# Patient Record
Sex: Male | Born: 1985 | Race: White | Hispanic: No | Marital: Married | Smoking: Never smoker
Health system: Southern US, Community
[De-identification: ages and names within clinical notes are randomized; demographics above are authoritative.]

## PROBLEM LIST (undated history)

## (undated) DIAGNOSIS — K219 Gastro-esophageal reflux disease without esophagitis: Secondary | ICD-10-CM

## (undated) DIAGNOSIS — F4310 Post-traumatic stress disorder, unspecified: Secondary | ICD-10-CM

## (undated) DIAGNOSIS — A6920 Lyme disease, unspecified: Secondary | ICD-10-CM

## (undated) DIAGNOSIS — A770 Spotted fever due to Rickettsia rickettsii: Secondary | ICD-10-CM

## (undated) DIAGNOSIS — I10 Essential (primary) hypertension: Secondary | ICD-10-CM

## (undated) DIAGNOSIS — J302 Other seasonal allergic rhinitis: Secondary | ICD-10-CM

## (undated) HISTORY — PX: ESOPHAGOGASTRODUODENOSCOPY ENDOSCOPY: SHX5814

## (undated) HISTORY — PX: KNEE SURGERY: SHX244

---

## 2011-05-05 ENCOUNTER — Encounter: Payer: Self-pay | Admitting: *Deleted

## 2011-05-05 ENCOUNTER — Emergency Department (HOSPITAL_COMMUNITY): Payer: Self-pay

## 2011-05-05 ENCOUNTER — Emergency Department (HOSPITAL_COMMUNITY)
Admission: EM | Admit: 2011-05-05 | Discharge: 2011-05-05 | Disposition: A | Discharge status: Home / Self Care | Payer: Self-pay | Attending: Emergency Medicine | Admitting: Emergency Medicine

## 2011-05-05 DIAGNOSIS — W260XXA Contact with knife, initial encounter: Secondary | ICD-10-CM | POA: Insufficient documentation

## 2011-05-05 DIAGNOSIS — W261XXA Contact with sword or dagger, initial encounter: Secondary | ICD-10-CM | POA: Insufficient documentation

## 2011-05-05 DIAGNOSIS — S61209A Unspecified open wound of unspecified finger without damage to nail, initial encounter: Secondary | ICD-10-CM | POA: Insufficient documentation

## 2011-05-05 DIAGNOSIS — Y92009 Unspecified place in unspecified non-institutional (private) residence as the place of occurrence of the external cause: Secondary | ICD-10-CM | POA: Insufficient documentation

## 2011-05-05 MED ORDER — HYDROCODONE-ACETAMINOPHEN 5-325 MG PO TABS: 1.0000 | ORAL_TABLET | ORAL | Status: AC | PRN

## 2011-05-05 MED ORDER — BUPIVACAINE HCL (PF) 0.5 % IJ SOLN
10.0000 mL | Freq: Once | INTRAMUSCULAR | Status: DC
  Filled 2011-05-05 (×2): qty 30

## 2011-05-05 MED ORDER — CEPHALEXIN 500 MG PO CAPS: 500.0000 mg | ORAL_CAPSULE | Freq: Four times a day (QID) | ORAL | Status: AC

## 2011-05-05 NOTE — ED Notes (Signed)
Pt reports was cutting weedeater line and accidentally cut tip of R index finger off.  Dressing was applied by triage rn and pt elevating extremitiy.

## 2011-05-05 NOTE — ED Notes (Signed)
Pt c/o laceration to his index finger with knife. Pt states that he closed his knife and cut the tip of his finger off. Pt has avulsion to his rt index finger and brought the tip of his finger with him. Pressure dressing applied and arm elevated.

## 2011-05-05 NOTE — ED Notes (Signed)
Dr. Allie Bossier called for Stuart Cook.

## 2011-05-05 NOTE — ED Notes (Signed)
PA Julie Idol at bedside. 

## 2011-05-09 NOTE — ED Provider Notes (Signed)
History     CSN: [id number] Arrival date & time: 05/05/2011  1:10 PM  Chief Complaint  Patient presents with  . Extremity Laceration   Patient is a 25 y.o. male presenting with skin laceration. The history is provided by the patient.  Laceration  The incident occurred less than 1 hour ago. The laceration is located on the right hand (index finger). Size: 0.5 cm round avulsion to volar distal pad.   The laceration mechanism was a a clean knife. The pain is at a severity of 5/10. The pain is moderate. The pain has been constant since onset. He reports no foreign bodies present. His tetanus status is UTD.    History reviewed. No pertinent past medical history.  Past Surgical History  Procedure Date  . Knee surgery     right    History reviewed. No pertinent family history.  History  Substance Use Topics  . Smoking status: Never Smoker   . Smokeless tobacco: Not on file  . Alcohol Use: No      Review of Systems  All other systems reviewed and are negative.    Physical Exam  BP 126/73  Pulse 84  Temp(Src) 98.3 F (36.8 C) (Oral)  Resp 20  Ht 6\' 1"  (1.854 m)  Wt 240 lb (108.863 kg)  BMI 31.66 kg/m2  SpO2 96%  Physical Exam  Nursing note and vitals reviewed. Constitutional: He is oriented to person, place, and time. He appears well-developed and well-nourished.  HENT:  Head: Normocephalic and atraumatic.  Eyes: Conjunctivae are normal.  Neck: Normal range of motion.  Cardiovascular: Normal rate and intact distal pulses.   Pulmonary/Chest: Effort normal.  Abdominal: Soft. Bowel sounds are normal. There is no tenderness.  Musculoskeletal: Normal range of motion.  Neurological: He is alert and oriented to person, place, and time.  Skin: Skin is warm and dry.       Avulsion right distal volar index finger with small venous bleeding noted.    Psychiatric: He has a normal mood and affect.    ED Course  LACERATION REPAIR Performed by: Tammara Massing L Authorized by:  Candis Musa Consent: Verbal consent obtained. Consent given by: patient Patient understanding: patient states understanding of the procedure being performed Body area: upper extremity Location details: right hand Laceration length: 0.5 cm Tendon involvement: none Nerve involvement: none Vascular damage: venule actively bled, improved slightly after cautery .  Applied wound seal to avulsion and applied pressure until wound was hemostatic. Anesthesia: digital block Local anesthetic: bupivacaine 0.5% with epinephrine Anesthetic total: 2 ml Preparation: Patient was prepped and draped in the usual sterile fashion. Irrigation solution: saline Irrigation method: syringe Amount of cleaning: extensive Debridement: none Wound skin closure material used: No skin closure appropriate for this avulsion injury. Dressing: 4x4 sterile gauze Patient tolerance: Patient tolerated the procedure well with no immediate complications.    MDM Avulsion injury to distal right index finger.  Spoke with Dr Magnus Ivan of hand who will followup with pt as outpatient - willing to see in office tomorrow.      Candis Musa, PA 05/09/11 0151

## 2011-05-11 NOTE — ED Provider Notes (Signed)
History/physical exam/procedure(s) were performed by non-physician practitioner and as supervising physician I was immediately available for consultation/collaboration. I have reviewed all notes and am in agreement with care and plan.  Hilario Quarry, MD 05/11/11 [phone]

## 2014-01-12 ENCOUNTER — Emergency Department (HOSPITAL_COMMUNITY): Payer: BC Managed Care – PPO

## 2014-01-12 ENCOUNTER — Emergency Department (HOSPITAL_COMMUNITY)
Admission: EM | Admit: 2014-01-12 | Discharge: 2014-01-12 | Disposition: A | Discharge status: Home / Self Care | Payer: BC Managed Care – PPO | Attending: Emergency Medicine | Admitting: Emergency Medicine

## 2014-01-12 ENCOUNTER — Encounter (HOSPITAL_COMMUNITY): Payer: Self-pay | Admitting: Emergency Medicine

## 2014-01-12 DIAGNOSIS — IMO0002 Reserved for concepts with insufficient information to code with codable children: Secondary | ICD-10-CM | POA: Insufficient documentation

## 2014-01-12 DIAGNOSIS — R079 Chest pain, unspecified: Secondary | ICD-10-CM | POA: Insufficient documentation

## 2014-01-12 DIAGNOSIS — R0602 Shortness of breath: Secondary | ICD-10-CM | POA: Insufficient documentation

## 2014-01-12 LAB — COMPREHENSIVE METABOLIC PANEL
ALT: 21 U/L (ref 0–53)
AST: 20 U/L (ref 0–37)
Albumin: 3.9 g/dL (ref 3.5–5.2)
Alkaline Phosphatase: 87 U/L (ref 39–117)
BUN: 17 mg/dL (ref 6–23)
CO2: 28 mEq/L (ref 19–32)
Calcium: 9.5 mg/dL (ref 8.4–10.5)
Chloride: 101 mEq/L (ref 96–112)
Creatinine, Ser: 1.22 mg/dL (ref 0.50–1.35)
GFR calc Af Amer: >90 mL/min (ref >90)
GFR calc non Af Amer: 81 mL/min — ABNORMAL LOW (ref >90)
Glucose, Bld: 92 mg/dL (ref 70–99)
Potassium: 4.1 mEq/L (ref 3.7–5.3)
Sodium: 141 mEq/L (ref 137–147)
Total Bilirubin: 0.5 mg/dL (ref 0.3–1.2)
Total Protein: 7.8 g/dL (ref 6.0–8.3)

## 2014-01-12 LAB — CBC WITH DIFFERENTIAL/PLATELET
Basophils Absolute: 0 10*3/uL (ref 0.0–0.1)
Basophils Relative: 0 % (ref 0–1)
Eosinophils Absolute: 0.4 10*3/uL (ref 0.0–0.7)
Eosinophils Relative: 4 % (ref 0–5)
HCT: 43.9 % (ref 39.0–52.0)
Hemoglobin: 15.7 g/dL (ref 13.0–17.0)
Lymphocytes Relative: 29 % (ref 12–46)
Lymphs Abs: 3.1 10*3/uL (ref 0.7–4.0)
MCH: 30.5 pg (ref 26.0–34.0)
MCHC: 35.8 g/dL (ref 30.0–36.0)
MCV: 85.4 fL (ref 78.0–100.0)
Monocytes Absolute: 0.8 10*3/uL (ref 0.1–1.0)
Monocytes Relative: 8 % (ref 3–12)
Neutro Abs: 6.3 10*3/uL (ref 1.7–7.7)
Neutrophils Relative %: 59 % (ref 43–77)
Platelets: 212 10*3/uL (ref 150–400)
RBC: 5.14 MIL/uL (ref 4.22–5.81)
RDW: 12.6 % (ref 11.5–15.5)
WBC: 10.7 10*3/uL — ABNORMAL HIGH (ref 4.0–10.5)

## 2014-01-12 LAB — TROPONIN I: Troponin I: <0.3 ng/mL (ref <0.30)

## 2014-01-12 LAB — D-DIMER, QUANTITATIVE: D-Dimer, Quant: <0.27 ug/mL-FEU (ref 0.00–0.48)

## 2014-01-12 MED ORDER — OXYCODONE-ACETAMINOPHEN 5-325 MG PO TABS
1.0000 | ORAL_TABLET | Freq: Once | ORAL | Status: AC
  Administered 2014-01-12: 1 via ORAL
  Filled 2014-01-12: qty 1

## 2014-01-12 MED ORDER — HYDROCODONE-ACETAMINOPHEN 5-325 MG PO TABS: 1.0000 | ORAL_TABLET | Freq: Four times a day (QID) | ORAL | Status: DC | PRN

## 2014-01-12 NOTE — ED Notes (Signed)
Patient given discharge instruction, verbalized understand. Patient ambulatory out of the department.  

## 2014-01-12 NOTE — ED Notes (Signed)
Pain central chest , onset on Friday, in rt lat chest.  Cough, nonproductive, vomited , feels that food is not going down to stomach.

## 2014-01-12 NOTE — ED Provider Notes (Signed)
CSN: [id number]     Arrival date & time 01/12/14  2008 History  This chart was scribed for Stuart LennertJoseph L Catrina Fellenz, MD by Bennett Scrapehristina Taylor, ED Scribe. This patient was seen in room APA04/[facility]04 and the patient's care was started at 8:45 PM.   Chief Complaint  Patient presents with  . Chest Pain      Patient is a 28 y.o. male presenting with chest pain. The history is provided by the patient. No language interpreter was used.  Chest Pain Pain location:  Substernal area Pain quality comment:  "pulled muscle" Duration:  4 days Chronicity:  New Associated symptoms: shortness of breath   Associated symptoms: no abdominal pain, no back pain, no cough, no fatigue and no headache   Risk factors: no smoking     HPI Comments: Stuart Cook is a 28 y.o. male who presents to the Emergency Department complaining of substernal CP that started 4 days ago. He states that the pain originally started at the "top of the ribs" on the right and felt like a "pulled muscle". He became concerned when he developed associated SOB and the pain relocated to the center chest. He denies any prior episodes of the same. He denies any recent cough or leg pain or swelling. He denies any h/o smoking.  History reviewed. No pertinent past medical history. Past Surgical History  Procedure Laterality Date  . Knee surgery      right  . Esophagogastroduodenoscopy endoscopy     History reviewed. No pertinent family history. History  Substance Use Topics  . Smoking status: Never Smoker   . Smokeless tobacco: Not on file  . Alcohol Use: No    Review of Systems  Constitutional: Negative for appetite change and fatigue.  HENT: Negative for congestion, ear discharge and sinus pressure.   Eyes: Negative for discharge.  Respiratory: Positive for shortness of breath. Negative for cough.   Cardiovascular: Positive for chest pain.  Gastrointestinal: Negative for abdominal pain and diarrhea.  Genitourinary: Negative for frequency  and hematuria.  Musculoskeletal: Negative for back pain.  Skin: Negative for rash.  Neurological: Negative for seizures and headaches.  Psychiatric/Behavioral: Negative for hallucinations.      Allergies  Review of patient's allergies indicates no known allergies.  Home Medications   Prior to Admission medications   Medication Sig Start Date End Date Taking? Authorizing Provider  Aspirin-Acetaminophen-Caffeine (GOODY HEADACHE PO) Take 1 packet by mouth daily as needed. For plain    Yes Historical Provider, MD  fluticasone (FLONASE) 50 MCG/ACT nasal spray Place 1 spray into both nostrils every morning. 12/09/13  Yes Historical Provider, MD  omeprazole (PRILOSEC) 20 MG capsule Take 20 mg by mouth once as needed.   Yes Historical Provider, MD  simethicone (GAS-X) 80 MG chewable tablet Chew 80 mg by mouth once as needed for flatulence.   Yes Historical Provider, MD   Triage Vitals: BP 126/82  Pulse 81  Temp(Src) 98.3 F (36.8 C) (Oral)  Resp 20  Ht 6\' 1"  (1.854 m)  Wt 287 lb (130.182 kg)  BMI 37.87 kg/m2  SpO2 97%  Physical Exam  Nursing note and vitals reviewed. Constitutional: He is oriented to person, place, and time. He appears well-developed and well-nourished.  HENT:  Head: Normocephalic and atraumatic.  Eyes: Conjunctivae and EOM are normal. No scleral icterus.  Neck: Neck supple. No thyromegaly present.  Cardiovascular: Normal rate and regular rhythm.  Exam reveals no gallop and no friction rub.   No murmur heard. Pulmonary/Chest: Effort  normal and breath sounds normal. No stridor. He has no wheezes. He has no rales. He exhibits no tenderness.  Abdominal: He exhibits no distension. There is no tenderness. There is no rebound.  Musculoskeletal: Normal range of motion. He exhibits no edema.  Lymphadenopathy:    He has no cervical adenopathy.  Neurological: He is alert and oriented to person, place, and time. He exhibits normal muscle tone. Coordination normal.  Skin:  Skin is warm and dry. No rash noted. No erythema.  Psychiatric: He has a normal mood and affect. His behavior is normal.    ED Course  Procedures (including critical care time)  Medications  oxyCODONE-acetaminophen (PERCOCET/ROXICET) 5-325 MG per tablet 1 tablet (not administered)    DIAGNOSTIC STUDIES: Oxygen Saturation is 97% on RA, adequate by my interpretation.    COORDINATION OF CARE: 8:50 PM-Discussed treatment plan which includes pain meds, CXR, CBC panel, CMP, troponin and d-dimer with pt at bedside and pt agreed to plan.   Labs Review Labs Reviewed - No data to display  Imaging Review No results found.   EKG Interpretation None      MDM   Final diagnoses:  None    The chart was scribed for me under my direct supervision.  I personally performed the history, physical, and medical decision making and all procedures in the evaluation of this patient.Stuart Lennert.      Karinda Cabriales L Kessler Kopinski, MD 01/12/14 [phone]

## 2014-01-12 NOTE — Discharge Instructions (Signed)
Continue the prilosec and follow up with your md this week

## 2014-02-05 ENCOUNTER — Inpatient Hospital Stay (HOSPITAL_COMMUNITY): Payer: BC Managed Care – PPO

## 2014-02-05 ENCOUNTER — Emergency Department (HOSPITAL_COMMUNITY): Payer: BC Managed Care – PPO

## 2014-02-05 ENCOUNTER — Encounter (HOSPITAL_COMMUNITY): Payer: Self-pay | Admitting: Emergency Medicine

## 2014-02-05 ENCOUNTER — Inpatient Hospital Stay (HOSPITAL_COMMUNITY)
Admission: EM | Admit: 2014-02-05 | Discharge: 2014-02-06 | DRG: 871 | Disposition: A | Discharge status: Home / Self Care | Payer: BC Managed Care – PPO | Attending: Internal Medicine | Admitting: Internal Medicine

## 2014-02-05 DIAGNOSIS — Z8249 Family history of ischemic heart disease and other diseases of the circulatory system: Secondary | ICD-10-CM | POA: Diagnosis not present

## 2014-02-05 DIAGNOSIS — R0902 Hypoxemia: Secondary | ICD-10-CM | POA: Diagnosis present

## 2014-02-05 DIAGNOSIS — Z789 Other specified health status: Secondary | ICD-10-CM

## 2014-02-05 DIAGNOSIS — R519 Headache, unspecified: Secondary | ICD-10-CM | POA: Diagnosis present

## 2014-02-05 DIAGNOSIS — A938 Other specified arthropod-borne viral fevers: Secondary | ICD-10-CM | POA: Diagnosis present

## 2014-02-05 DIAGNOSIS — R51 Headache: Secondary | ICD-10-CM | POA: Diagnosis present

## 2014-02-05 DIAGNOSIS — G9340 Encephalopathy, unspecified: Secondary | ICD-10-CM | POA: Diagnosis present

## 2014-02-05 DIAGNOSIS — Z9189 Other specified personal risk factors, not elsewhere classified: Secondary | ICD-10-CM | POA: Diagnosis present

## 2014-02-05 DIAGNOSIS — B349 Viral infection, unspecified: Secondary | ICD-10-CM

## 2014-02-05 DIAGNOSIS — J320 Chronic maxillary sinusitis: Secondary | ICD-10-CM | POA: Diagnosis present

## 2014-02-05 DIAGNOSIS — T40605A Adverse effect of unspecified narcotics, initial encounter: Secondary | ICD-10-CM | POA: Diagnosis present

## 2014-02-05 DIAGNOSIS — I10 Essential (primary) hypertension: Secondary | ICD-10-CM | POA: Diagnosis present

## 2014-02-05 DIAGNOSIS — A419 Sepsis, unspecified organism: Principal | ICD-10-CM | POA: Diagnosis present

## 2014-02-05 DIAGNOSIS — R6510 Systemic inflammatory response syndrome (SIRS) of non-infectious origin without acute organ dysfunction: Secondary | ICD-10-CM | POA: Diagnosis present

## 2014-02-05 DIAGNOSIS — K219 Gastro-esophageal reflux disease without esophagitis: Secondary | ICD-10-CM | POA: Diagnosis present

## 2014-02-05 DIAGNOSIS — R112 Nausea with vomiting, unspecified: Secondary | ICD-10-CM | POA: Diagnosis present

## 2014-02-05 DIAGNOSIS — R509 Fever, unspecified: Secondary | ICD-10-CM | POA: Diagnosis present

## 2014-02-05 HISTORY — DX: Other seasonal allergic rhinitis: J30.2

## 2014-02-05 HISTORY — DX: Lyme disease, unspecified: A69.20

## 2014-02-05 HISTORY — DX: Gastro-esophageal reflux disease without esophagitis: K21.9

## 2014-02-05 HISTORY — DX: Spotted fever due to Rickettsia rickettsii: A77.0

## 2014-02-05 HISTORY — DX: Essential (primary) hypertension: I10

## 2014-02-05 LAB — COMPREHENSIVE METABOLIC PANEL
ALT: 24 U/L (ref 0–53)
AST: 23 U/L (ref 0–37)
Albumin: 3.6 g/dL (ref 3.5–5.2)
Alkaline Phosphatase: 81 U/L (ref 39–117)
BUN: 13 mg/dL (ref 6–23)
CO2: 22 mEq/L (ref 19–32)
Calcium: 8.7 mg/dL (ref 8.4–10.5)
Chloride: 97 mEq/L (ref 96–112)
Creatinine, Ser: 1.04 mg/dL (ref 0.50–1.35)
GFR calc Af Amer: >90 mL/min (ref >90)
GFR calc non Af Amer: >90 mL/min (ref >90)
Glucose, Bld: 141 mg/dL — ABNORMAL HIGH (ref 70–99)
Potassium: 3.7 mEq/L (ref 3.7–5.3)
Sodium: 135 mEq/L — ABNORMAL LOW (ref 137–147)
Total Bilirubin: 0.7 mg/dL (ref 0.3–1.2)
Total Protein: 7.3 g/dL (ref 6.0–8.3)

## 2014-02-05 LAB — CBC WITH DIFFERENTIAL/PLATELET
Basophils Absolute: 0 10*3/uL (ref 0.0–0.1)
Basophils Relative: 0 % (ref 0–1)
Eosinophils Absolute: 0 10*3/uL (ref 0.0–0.7)
Eosinophils Relative: 0 % (ref 0–5)
HCT: 42 % (ref 39.0–52.0)
Hemoglobin: 14.7 g/dL (ref 13.0–17.0)
Lymphocytes Relative: 10 % — ABNORMAL LOW (ref 12–46)
Lymphs Abs: 0.9 10*3/uL (ref 0.7–4.0)
MCH: 29.5 pg (ref 26.0–34.0)
MCHC: 35 g/dL (ref 30.0–36.0)
MCV: 84.2 fL (ref 78.0–100.0)
Monocytes Absolute: 0.6 10*3/uL (ref 0.1–1.0)
Monocytes Relative: 7 % (ref 3–12)
Neutro Abs: 7.5 10*3/uL (ref 1.7–7.7)
Neutrophils Relative %: 83 % — ABNORMAL HIGH (ref 43–77)
Platelets: 173 10*3/uL (ref 150–400)
RBC: 4.99 MIL/uL (ref 4.22–5.81)
RDW: 12.9 % (ref 11.5–15.5)
WBC: 9.1 10*3/uL (ref 4.0–10.5)

## 2014-02-05 LAB — BLOOD GAS, ARTERIAL
Acid-base deficit: 1 mmol/L (ref 0.0–2.0)
Bicarbonate: 22.6 mEq/L (ref 20.0–24.0)
Drawn by: 338401
FIO2: 0.21 %
O2 Saturation: 93.5 %
Patient temperature: 37
TCO2: 19.7 mmol/L (ref 0–100)
pCO2 arterial: 33.7 mmHg — ABNORMAL LOW (ref 35.0–45.0)
pH, Arterial: 7.441 (ref 7.350–7.450)
pO2, Arterial: 63.4 mmHg — ABNORMAL LOW (ref 80.0–100.0)

## 2014-02-05 LAB — URINALYSIS, ROUTINE W REFLEX MICROSCOPIC
Bilirubin Urine: NEGATIVE
Glucose, UA: NEGATIVE mg/dL
Ketones, ur: NEGATIVE mg/dL
Leukocytes, UA: NEGATIVE
Nitrite: NEGATIVE
Protein, ur: NEGATIVE mg/dL
Specific Gravity, Urine: <1.005 — ABNORMAL LOW (ref 1.005–1.030)
Urobilinogen, UA: 1 mg/dL (ref 0.0–1.0)
pH: 6 (ref 5.0–8.0)

## 2014-02-05 LAB — PROTIME-INR
INR: 1.2 (ref 0.00–1.49)
Prothrombin Time: 14.9 seconds (ref 11.6–15.2)

## 2014-02-05 LAB — CSF CELL COUNT WITH DIFFERENTIAL
RBC Count, CSF: 0 /mm3
Tube #: 4
WBC, CSF: 2 /mm3 (ref 0–5)

## 2014-02-05 LAB — URINE MICROSCOPIC-ADD ON

## 2014-02-05 LAB — APTT: aPTT: 35 seconds (ref 24–37)

## 2014-02-05 LAB — TROPONIN I: Troponin I: <0.3 ng/mL (ref <0.30)

## 2014-02-05 LAB — GLUCOSE, CSF: Glucose, CSF: 71 mg/dL (ref 43–76)

## 2014-02-05 LAB — PROTEIN, CSF: Total  Protein, CSF: 22 mg/dL (ref 15–45)

## 2014-02-05 MED ORDER — SODIUM CHLORIDE 0.9 % IV BOLUS (SEPSIS)
2000.0000 mL | Freq: Once | INTRAVENOUS | Status: AC
  Administered 2014-02-05: 1000 mL via INTRAVENOUS

## 2014-02-05 MED ORDER — DOXYCYCLINE HYCLATE 100 MG PO TABS: 200.0000 mg | ORAL_TABLET | Freq: Once | ORAL | Status: DC

## 2014-02-05 MED ORDER — METOCLOPRAMIDE HCL 5 MG/ML IJ SOLN
10.0000 mg | Freq: Once | INTRAMUSCULAR | Status: AC
  Administered 2014-02-05: 10 mg via INTRAVENOUS
  Filled 2014-02-05: qty 2

## 2014-02-05 MED ORDER — PANTOPRAZOLE SODIUM 40 MG PO TBEC
40.0000 mg | DELAYED_RELEASE_TABLET | Freq: Every day | ORAL | Status: DC
  Administered 2014-02-05 – 2014-02-06 (×2): 40 mg via ORAL
  Filled 2014-02-05 (×2): qty 1

## 2014-02-05 MED ORDER — MIDAZOLAM HCL 2 MG/2ML IJ SOLN
INTRAMUSCULAR | Status: AC | PRN
  Administered 2014-02-05: 2 mg via INTRAVENOUS

## 2014-02-05 MED ORDER — ONDANSETRON HCL 4 MG/2ML IJ SOLN: 4.0000 mg | Freq: Four times a day (QID) | INTRAMUSCULAR | Status: DC | PRN

## 2014-02-05 MED ORDER — POTASSIUM CHLORIDE IN NACL 20-0.9 MEQ/L-% IV SOLN
INTRAVENOUS | Status: DC
  Administered 2014-02-05 (×2): via INTRAVENOUS

## 2014-02-05 MED ORDER — ACETAMINOPHEN 325 MG PO TABS: 650.0000 mg | ORAL_TABLET | Freq: Four times a day (QID) | ORAL | Status: DC | PRN

## 2014-02-05 MED ORDER — LORATADINE 10 MG PO TABS
10.0000 mg | ORAL_TABLET | Freq: Every day | ORAL | Status: DC
  Administered 2014-02-06: 10 mg via ORAL
  Filled 2014-02-05: qty 1

## 2014-02-05 MED ORDER — KETOROLAC TROMETHAMINE 30 MG/ML IJ SOLN
30.0000 mg | Freq: Once | INTRAMUSCULAR | Status: AC
  Administered 2014-02-05: 30 mg via INTRAVENOUS
  Filled 2014-02-05: qty 1

## 2014-02-05 MED ORDER — MORPHINE SULFATE 4 MG/ML IJ SOLN
4.0000 mg | INTRAMUSCULAR | Status: DC | PRN
  Administered 2014-02-05 – 2014-02-06 (×4): 4 mg via INTRAVENOUS
  Filled 2014-02-05 (×4): qty 1

## 2014-02-05 MED ORDER — ACETAMINOPHEN 325 MG PO TABS
650.0000 mg | ORAL_TABLET | Freq: Once | ORAL | Status: AC
  Administered 2014-02-05: 650 mg via ORAL

## 2014-02-05 MED ORDER — HYDROMORPHONE HCL PF 1 MG/ML IJ SOLN
1.0000 mg | Freq: Once | INTRAMUSCULAR | Status: AC
  Administered 2014-02-05: 1 mg via INTRAVENOUS
  Filled 2014-02-05: qty 1

## 2014-02-05 MED ORDER — FLUTICASONE PROPIONATE 50 MCG/ACT NA SUSP
1.0000 | Freq: Every morning | NASAL | Status: DC
  Administered 2014-02-06: 1 via NASAL
  Filled 2014-02-05: qty 16

## 2014-02-05 MED ORDER — LIDOCAINE HCL (PF) 1 % IJ SOLN
INTRAMUSCULAR | Status: AC
  Filled 2014-02-05: qty 5

## 2014-02-05 MED ORDER — GUAIFENESIN-DM 100-10 MG/5ML PO SYRP: 5.0000 mL | ORAL_SOLUTION | ORAL | Status: DC | PRN

## 2014-02-05 MED ORDER — ALBUTEROL SULFATE (2.5 MG/3ML) 0.083% IN NEBU: 2.5000 mg | INHALATION_SOLUTION | RESPIRATORY_TRACT | Status: DC | PRN

## 2014-02-05 MED ORDER — ONDANSETRON HCL 4 MG PO TABS: 4.0000 mg | ORAL_TABLET | Freq: Four times a day (QID) | ORAL | Status: DC | PRN

## 2014-02-05 MED ORDER — ACETAMINOPHEN 325 MG PO TABS
ORAL_TABLET | ORAL | Status: AC
  Administered 2014-02-05: 650 mg via ORAL
  Filled 2014-02-05: qty 2

## 2014-02-05 MED ORDER — ALUM & MAG HYDROXIDE-SIMETH 200-200-20 MG/5ML PO SUSP: 30.0000 mL | Freq: Four times a day (QID) | ORAL | Status: DC | PRN

## 2014-02-05 MED ORDER — MIDAZOLAM HCL 2 MG/2ML IJ SOLN
2.0000 mg | Freq: Once | INTRAMUSCULAR | Status: AC
  Administered 2014-02-05: 2 mg via INTRAVENOUS

## 2014-02-05 MED ORDER — MIDAZOLAM HCL 2 MG/2ML IJ SOLN
INTRAMUSCULAR | Status: AC
  Filled 2014-02-05: qty 2

## 2014-02-05 MED ORDER — DOXYCYCLINE HYCLATE 100 MG IV SOLR
200.0000 mg | Freq: Two times a day (BID) | INTRAVENOUS | Status: DC
  Administered 2014-02-05 – 2014-02-06 (×3): 200 mg via INTRAVENOUS
  Filled 2014-02-05 (×9): qty 200

## 2014-02-05 MED ORDER — IBUPROFEN 800 MG PO TABS
400.0000 mg | ORAL_TABLET | Freq: Four times a day (QID) | ORAL | Status: DC | PRN
  Administered 2014-02-05 – 2014-02-06 (×2): 400 mg via ORAL
  Filled 2014-02-05 (×2): qty 1

## 2014-02-05 MED ORDER — LIDOCAINE HCL (PF) 1 % IJ SOLN
INTRAMUSCULAR | Status: AC
  Administered 2014-02-05: 10:00:00
  Filled 2014-02-05: qty 5

## 2014-02-05 NOTE — H&P (Signed)
[facility] History and Physical  Stuart SoxJonathan Ascencio KGM:[id number] DOB: 1986-06-08 DOA: 02/05/2014  Referring physician: ED MD Dr. Rhunette CroftNanavati PCP: No PCP Per Patient   Chief Complaint: Generalized body aches, confusion, headache, fever, chills, nausea, and vomiting.  HPI: Stuart Cook is a 28 y.o. male with a history of RMSF, Lyme disease, borderline hypertension, and seasonal allergies, who presents with a complaint of generalized body aches, headache, nausea, vomiting, fever, and chills. His wife also reports some confusion. The patient was in his usual state of health until yesterday afternoon. At that time, he developed chills, body aches, and a posterior headache. He also noticed some visual changes, notably blurred vision for a few minutes. He had pain behind his eyes. He had mild photophobia. He denies neck pain or neck stiffness. He also developed a fever of 102. Later in the afternoon and evening, he started having nausea and vomiting. He had approximately 5-10 episodes of vomiting. There was no evidence of coffee grounds emesis. He denies diarrhea. He denies abdominal pain. He denies any unusual rash, but he did notice a small nodule on his right buttock. He had been fishing several days ago and part of his travel was through the woods. His wife pulled off 3 ticks from their dog who was also with them.  In the emergency department, he was febrile with a temperature of 100.7, tachycardic with a heart rate of 126, and transiently hypoxic with oxygen saturation of 83%. His oxygen saturation improved when he sat up and when supplemental oxygen was applied. His white blood cell count was within normal limits. His chest x-ray revealed no acute cardiopulmonary disease. CT of his head revealed no acute intracranial finding, but with partial opacification of the left maxillary sinus. LP was performed by radiology. CSF results revealed 2 WBCs, glucose 71, and protein 22. CSF culture pending. He is  being admitted for further evaluation and management.     Review of Systems:  As above in history present illness. In addition, he has chronic allergic rhinitis and occasional heartburn. Otherwise review of systems is negative.  Past Medical History  Diagnosis Date  . Hypertension     Borderline and treated with diet alone.  Marland Kitchen. GERD (gastroesophageal reflux disease)   . Seasonal allergies   . Lyme disease   . 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 Knee surgery      right  . Esophagogastroduodenoscopy endoscopy     Social History: He is married. Has 2 children. He is employed in heating and cooling. [place] He denies alcohol, tobacco, and illicit drug use.  No Known Allergies  Family history: His mother is healthy with the exception of "knee problems". His father has hypertension.  Prior to Admission medications   Medication Sig Start Date End Date Taking? Authorizing Provider  Chlorpheniramine Maleate (ALLERGY PO) Take 1 tablet by mouth daily.   Yes Historical Provider, MD  fluticasone (FLONASE) 50 MCG/ACT nasal spray Place 1 spray into both nostrils every morning. 12/09/13  Yes Historical Provider, MD  ibuprofen (ADVIL,MOTRIN) 200 MG tablet Take 400 mg by mouth every 6 (six) hours as needed for mild pain.   Yes Historical Provider, MD  omeprazole (PRILOSEC) 20 MG capsule Take 20 mg by mouth once as needed.   Yes Historical Provider, MD  aspirin 81 MG chewable tablet Chew 243 mg by mouth daily as needed for headache.    Historical Provider, MD  Physical Exam: Filed Vitals:   02/05/14 1445  BP: 112/65  Pulse: 79  Temp: 97.6 F (36.4 C)  Resp: 18    BP 112/65  Pulse 79  Temp(Src) 97.6 F (36.4 C) (Oral)  Resp 18  Ht 6\' 1"  (1.854 m)  Wt 130.324 kg (287 lb 5 oz)  BMI 37.91 kg/m2  SpO2 97%  General: Alert 28 year old Caucasian man laying in bed, in no acute distress. Scalp/face: No scalp  tenderness. No maxillary sinus tenderness. Eyes: PERRL, normal lids, irises & conjunctiva; mild photophobia. ENT: grossly normal hearing, lips & tongue; no exudates or erythema.  Neck: no LAD, masses or thyromegaly; no nuchal rigidity. Cardiovascular: RRR, no m/r/g. No LE edema. Telemetry: Not applicable  Respiratory: CTA bilaterally, no w/r/r. Normal respiratory effort. Abdomen: Positive bowel sounds, soft, nontender, nondistended. Skin: 1 large multicolored tattoo on the left upper extremity and right upper extremity. Small nodule-like lesion, red, and nontender on the right buttock; this nodule has no surrounding migrating erythema. He has multiple pimples on his chest (query acne versus mild folliculitis) but no discrete rash. Musculoskeletal: grossly normal tone BUE/BLE Psychiatric: grossly normal mood and affect, speech fluent and appropriate Neurologic: He is alert and oriented x3. Cranial nerves II through XII are intact. Strength is 5 over 5 throughout. Sensation is grossly intact symmetrically.           Labs on Admission:  Basic Metabolic Panel:  Recent Labs Lab 02/05/14 0524  NA 135*  K 3.7  CL 97  CO2 22  GLUCOSE 141*  BUN 13  CREATININE 1.04  CALCIUM 8.7   Liver Function Tests:  Recent Labs Lab 02/05/14 0524  AST 23  ALT 24  ALKPHOS 81  BILITOT 0.7  PROT 7.3  ALBUMIN 3.6   No results found for this basename: LIPASE, AMYLASE,  in the last 168 hours No results found for this basename: AMMONIA,  in the last 168 hours CBC:  Recent Labs Lab 02/05/14 0524  WBC 9.1  NEUTROABS 7.5  HGB 14.7  HCT 42.0  MCV 84.2  PLT 173   Cardiac Enzymes:  Recent Labs Lab 02/05/14 0524  TROPONINI <0.30    BNP (last 3 results) No results found for this basename: PROBNP,  in the last 8760 hours CBG: No results found for this basename: GLUCAP,  in the last 168 hours  Radiological Exams on Admission: Dg Chest 2 View  02/05/2014   CLINICAL DATA:  Headache for 2  days. Generalized body aches, nausea and vomiting.  EXAM: CHEST  2 VIEW  COMPARISON:  Chest radiograph performed 01/12/2014  FINDINGS: The lungs are well-aerated and clear. There is no evidence of focal opacification, pleural effusion or pneumothorax.  The heart is borderline normal in size; the mediastinal contour is within normal limits. No acute osseous abnormalities are seen.  IMPRESSION: No acute cardiopulmonary process seen.   Electronically Signed   By: Roanna Raider M.D.   On: 02/05/2014 06:50   Ct Head Wo Contrast  02/05/2014   CLINICAL DATA:  Headache for 2 days. Generalized body aches, nausea and vomiting.  EXAM: CT HEAD WITHOUT CONTRAST  TECHNIQUE: Contiguous axial images were obtained from the base of the skull through the vertex without intravenous contrast.  COMPARISON:  None.  FINDINGS: There is no evidence of acute infarction, mass lesion, or intra- or extra-axial hemorrhage on CT.  The posterior fossa, including the cerebellum, brainstem and fourth ventricle, is within normal limits. The third and lateral ventricles, and basal ganglia  are unremarkable in appearance. The cerebral hemispheres are symmetric in appearance, with normal gray-white differentiation. No mass effect or midline shift is seen.  There is no evidence of fracture; visualized osseous structures are unremarkable in appearance. The orbits are within normal limits. There is partial opacification of the left maxillary sinus. The remaining paranasal sinuses and mastoid air cells are well-aerated. No significant soft tissue abnormalities are seen.  IMPRESSION: 1. No acute intracranial pathology seen on CT. 2. Partial opacification of the left maxillary sinus.   Electronically Signed   By: Roanna Raider M.D.   On: 02/05/2014 06:16   Dg Fluoro Guide Lumbar Puncture  02/05/2014   CLINICAL DATA:  Headache and fever.  EXAM: DIAGNOSTIC LUMBAR PUNCTURE UNDER FLUOROSCOPIC GUIDANCE  FLUOROSCOPY TIME:  0 min 24 seconds  PROCEDURE:  Informed consent was obtained from the patient prior to the procedure, including potential complications of headache, allergy, and pain. With the patient prone, the lower back was prepped with Betadine. 1% Lidocaine was used for local anesthesia. Lumbar puncture was performed at the L4-5 level using a 20 gauge needle with return of clear colorless CSF with an opening pressure of 24 cm water. 28ml of CSF were obtained for laboratory studies. The patient tolerated the procedure well and there were no apparent complications.  IMPRESSION: Lumbar puncture performed without complication. The patient tolerated the procedure well.   Electronically Signed   By: Geanie Cooley M.D.   On: 02/05/2014 11:25    EKG: Independently reviewed. Sinus tachycardia with a heart rate of 101.  Assessment/Plan Principal Problem:   Sepsis Active Problems:   Encephalopathy acute   At high risk for tick borne illness   Headache(784.0)   Nausea with vomiting   Left maxillary sinusitis   Hypoxia   1. Sign/symptom complex with confusion, myalgias, headache, nausea, vomiting, and fever. His symptomatology is consistent with a viral or tickborne infection causing encephalopathy. His lumbar puncture/CSF results are not consistent with an acute meningitis. 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 His biochemical and clinical presentation in the ED meet the criteria for sepsis, likely early sepsis. His hypoxia was transient and related to being given IV opiate during the lumbar puncture in the ED. He is now oxygenating in the upper 90s on room air. He has partial left maxillary sinusitis which appears to not be an active issue. This will be covered with doxycycline. He will be continued on Flonase and antihistamine therapy.    Plan: 1. Continue IV  doxycycline. 2. A number of laboratory studies/cultures were ordered in the ED including CSF culture, [facility] spotted fever CSF antibodies, [facility] spotted fever IgM and IgG, CSF HSV PCR, blood Lyme disease PCR, and blood cultures. We'll add an acute viral hepatitis panel because of his tattoos. We'll review the results when available. 3. IV fluid hydration and supportive treatment with analgesics and antiemetics as needed. 4. Oxygen if needed to keep his oxygen saturations greater than 90%. 5. Full liquid diet and advance as tolerated. Continue PPI. 6. Continue antihistamine and steroid nasal spray. 7. Neuro checks every 4 hours x24.    Code Status: Full code Family Communication: Discussed with his wife and mother Disposition Plan: Discharge to home when clinically appropriate.  Time spent: One hour and 15 minutes.  Elliot Cousin [facility] Pager  [phone]  **Disclaimer: This note may have been dictated with voice recognition software. Similar sounding words can inadvertently be transcribed and this note may contain transcription errors which may not have been corrected upon publication of note.**
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
## 2014-02-05 NOTE — ED Notes (Signed)
Patient c/o generalized body aches with dizziness and nausea/vomiting.

## 2014-02-05 NOTE — ED Notes (Signed)
LP kit sit up in room, consent signed, family at the bedside.  

## 2014-02-06 DIAGNOSIS — R51 Headache: Secondary | ICD-10-CM

## 2014-02-06 LAB — COMPREHENSIVE METABOLIC PANEL
ALT: 15 U/L (ref 0–53)
AST: 17 U/L (ref 0–37)
Albumin: 2.9 g/dL — ABNORMAL LOW (ref 3.5–5.2)
Alkaline Phosphatase: 61 U/L (ref 39–117)
BUN: 9 mg/dL (ref 6–23)
CO2: 22 mEq/L (ref 19–32)
Calcium: 8.5 mg/dL (ref 8.4–10.5)
Chloride: 107 mEq/L (ref 96–112)
Creatinine, Ser: 0.85 mg/dL (ref 0.50–1.35)
GFR calc Af Amer: >90 mL/min (ref >90)
GFR calc non Af Amer: >90 mL/min (ref >90)
Glucose, Bld: 99 mg/dL (ref 70–99)
Potassium: 4.3 mEq/L (ref 3.7–5.3)
Sodium: 142 mEq/L (ref 137–147)
Total Bilirubin: 0.4 mg/dL (ref 0.3–1.2)
Total Protein: 6.1 g/dL (ref 6.0–8.3)

## 2014-02-06 LAB — CBC
HCT: 39.3 % (ref 39.0–52.0)
Hemoglobin: 13 g/dL (ref 13.0–17.0)
MCH: 28.4 pg (ref 26.0–34.0)
MCHC: 33.1 g/dL (ref 30.0–36.0)
MCV: 86 fL (ref 78.0–100.0)
Platelets: 162 10*3/uL (ref 150–400)
RBC: 4.57 MIL/uL (ref 4.22–5.81)
RDW: 13.4 % (ref 11.5–15.5)
WBC: 6.9 10*3/uL (ref 4.0–10.5)

## 2014-02-06 LAB — HERPES SIMPLEX VIRUS(HSV) DNA BY PCR
HSV 1 DNA: NOT DETECTED
HSV 2 DNA: NOT DETECTED

## 2014-02-06 LAB — HEPATITIS PANEL, ACUTE
HCV Ab: NEGATIVE
Hep A IgM: NONREACTIVE
Hep B C IgM: NONREACTIVE
Hepatitis B Surface Ag: NEGATIVE

## 2014-02-06 LAB — ROCKY MTN SPOTTED FVR AB, IGG-BLOOD: RMSF IgG: 0.17 IV

## 2014-02-06 LAB — ROCKY MTN SPOTTED FVR AB, IGM-BLOOD: RMSF IgM: 0.86 IV (ref 0.00–0.89)

## 2014-02-06 MED ORDER — HYDROCODONE-ACETAMINOPHEN 5-325 MG PO TABS: 1.0000 | ORAL_TABLET | Freq: Four times a day (QID) | ORAL | Status: AC | PRN

## 2014-02-06 MED ORDER — DOXYCYCLINE HYCLATE 100 MG PO TABS: 100.0000 mg | ORAL_TABLET | Freq: Two times a day (BID) | ORAL | Status: DC

## 2014-02-06 NOTE — Discharge Summary (Signed)
Physician Discharge Summary  Stuart Cook WUJ:[id number] DOB: February 27, 1986 DOA: 02/05/2014  PCP: No PCP Per Patient  Admit date: 02/05/2014 Discharge date: 02/06/2014  Time spent: Greater than 30 minutes  Recommendations for Outpatient Follow-up:  1. The patient was instructed to complete a course of doxycycline. 2. CSF studies and other studies were pending at the time of discharge. One resulted, the patient will be notified of abnormalities.   Discharge Diagnoses:  1. Likely tick borne illness/infection. 2. Sepsis secondary to #1. 3. Sign/symptom complex with confusion/acute encephalopathy, myalgias, headache, nausea, vomiting, and fever, secondary to #1. 4. Left maxillary sinusitis. 5. Transient hypoxia secondary to opiate analgesics and atelectasis. Resolved.  Discharge Condition: Improved.  Diet recommendation: Regular as tolerated.  Filed Weights   02/05/14 0324 02/05/14 1146  Weight: 122.471 kg (270 lb) 130.324 kg (287 lb 5 oz)    History of present illness:  Stuart Cook is a 28 y.o. male with a history of RMSF, Lyme disease, borderline hypertension, and seasonal allergies, who presented with a complaint of generalized body aches, headache, nausea, vomiting, fever, and chills. His wife also reported some confusion. The patient was in his usual state of health until yesterday afternoon. At that time, he developed chills, body aches, and a posterior headache. He also noticed some visual changes, notably blurred vision for a few minutes. He had pain behind his eyes. He had mild photophobia. He denied neck pain or neck stiffness. He also developed a fever of 102. Later in the afternoon and evening, he started having nausea and vomiting. He had approximately 5-10 episodes of vomiting. There was no evidence of coffee grounds emesis. He denied diarrhea. He denied abdominal pain. He denied any unusual rash, but he did notice a small nodule on his right buttock. He had been fishing  several days ago and part of his travel was through the woods. His wife pulled off 3 ticks from their dog who was also with them.  In the emergency department, he was febrile with a temperature of 100.7, tachycardic with a heart rate of 126, and transiently hypoxic with oxygen saturation of 83%. His oxygen saturation improved when he sat up and when supplemental oxygen was applied. His white blood cell count was within normal limits. His chest x-ray revealed no acute cardiopulmonary disease. CT of his head revealed no acute intracranial finding, but with partial opacification of the left maxillary sinus. LP was performed by radiology. CSF results revealed 2 WBCs, glucose 71, and protein 22. CSF culture pending. He was admitted for further evaluation and management.   Hospital Course:  The patient was started on supportive treatment, IV fluids, and IV doxycycline. His chronic medications were continued. There was a high clinical suspicion for a tickborne infection/illness in the setting of probable tick exposure and previous history of [facility] spotted fever and Lyme disease. His neurological status was monitored every 4 hours x24 hours. Analgesics were ordered as needed for pain. Tylenol was ordered as needed for fever. A number of studies were ordered by the ED physician in concert with my recommendations. They included CSF culture, [facility] spotted fever CSF antibodies, [facility] spotted fever IgG and IgM, CSF HSV PCR, blood Lyme disease PCR, and blood cultures. Also, because of the patient's tattoos, a viral hepatitis panel was ordered.  He improved clinically and symptomatically. His fever completely resolved. His headache, nausea, and vomiting completely resolved. He was oxygenating 95-97% on room air at the time of discharge. Neurologically, he demonstrated no confusion  or encephalopathy. His diet was advanced which he tolerated well. His blood cultures were negative times one day, but  the final results were pending. CSF culture was negative x1 day. Acute viral hepatitis panel was negative. All of the other studies as mentioned above, were pending at the time of discharge.  The patient was informed that the dictating physician would review the results when they were finalized. He was informed that the dictating physician would notify him of the results. He voiced understanding. He was instructed to not return to work until February 09, 2014. He voiced understanding. He was strongly encouraged to finish the 10 day course of doxycycline as prescribed.  Procedures:  Lumbar puncture per radiology while patient was in the ED.  Consultations:  None  Discharge Exam: Filed Vitals:   02/06/14 0253  BP: 114/68  Pulse: 74  Temp: 97.9 F (36.6 C)  Resp: 16    General: 28 year old man in no acute distress. Neck: No nuchal rigidity, no adenopathy. Cardiovascular: S1, S2, with no murmurs rubs or gallops. Respiratory: Clear to auscultation bilaterally. Skin: Small pimple like nodule on the right buttock without any surrounding migrating erythema. Neurologic: He is alert and oriented x3. Cranial nerves II through XII are intact.  Discharge Instructions You were cared for by a hospitalist during your hospital stay. If you have any questions about your discharge medications or the care you received while you were in the hospital after you are discharged, you can call the unit and asked to speak with the hospitalist on call if the hospitalist that took care of you is not available. Once you are discharged, your primary care physician will handle any further medical issues. Please note that NO REFILLS for any discharge medications will be authorized once you are discharged, as it is imperative that you return to your primary care physician (or establish a relationship with a primary care physician if you do not have one) for your aftercare needs so that they can reassess your need for  medications and monitor your lab values.  Discharge Instructions   Diet general    Complete by:  As directed      Discharge instructions    Complete by:  As directed   Return to work on 02/09/2014. Take medications as prescribed.     Increase activity slowly    Complete by:  As directed             Medication List    STOP taking these medications       aspirin 81 MG chewable tablet      TAKE these medications       ALLERGY PO  Take 1 tablet by mouth daily.     doxycycline 100 MG tablet  Commonly known as:  VIBRA-TABS  Take 1 tablet (100 mg total) by mouth 2 (two) times daily. Take this medication until completely finished.     fluticasone 50 MCG/ACT nasal spray  Commonly known as:  FLONASE  Place 1 spray into both nostrils every morning.     HYDROcodone-acetaminophen 5-325 MG per tablet  Commonly known as:  NORCO/VICODIN  Take 1 tablet by mouth every 6 (six) hours as needed for moderate pain.     ibuprofen 200 MG tablet  Commonly known as:  ADVIL,MOTRIN  Take 400 mg by mouth every 6 (six) hours as needed for mild pain.     omeprazole 20 MG capsule  Commonly known as:  PRILOSEC  Take 20 mg by mouth once  as needed.       No Known Allergies     Follow-up Information   [facility] (Followup as previously scheduled.)    Contact information:   [address] [place] [id number] [phone]        The results of significant diagnostics from this hospitalization (including imaging, microbiology, ancillary and laboratory) are listed below for reference.    Significant Diagnostic Studies: Dg Chest 2 View  02/05/2014   CLINICAL DATA:  Headache for 2 days. Generalized body aches, nausea and vomiting.  EXAM: CHEST  2 VIEW  COMPARISON:  Chest radiograph performed 01/12/2014  FINDINGS: The lungs are well-aerated and clear. There is no evidence of focal opacification, pleural effusion or pneumothorax.  The heart is borderline  normal in size; the mediastinal contour is within normal limits. No acute osseous abnormalities are seen.  IMPRESSION: No acute cardiopulmonary process seen.   Electronically Signed   By: Roanna RaiderJeffery  Chang M.D.   On: 02/05/2014 06:50   Dg Chest 2 View  01/12/2014   CLINICAL DATA:  Substernal chest pain x4 days, shortness of breath  EXAM: CHEST  2 VIEW  COMPARISON:  None.  FINDINGS: Lungs are clear.  No pleural effusion or pneumothorax.  The heart is normal in size.  Visualized osseous structures are within normal limits.  IMPRESSION: No evidence of acute cardiopulmonary disease.   Electronically Signed   By: Charline BillsSriyesh  Krishnan M.D.   On: 01/12/2014 21:29   Ct Head Wo Contrast  02/05/2014   CLINICAL DATA:  Headache for 2 days. Generalized body aches, nausea and vomiting.  EXAM: CT HEAD WITHOUT CONTRAST  TECHNIQUE: Contiguous axial images were obtained from the base of the skull through the vertex without intravenous contrast.  COMPARISON:  None.  FINDINGS: There is no evidence of acute infarction, mass lesion, or intra- or extra-axial hemorrhage on CT.  The posterior fossa, including the cerebellum, brainstem and fourth ventricle, is within normal limits. The third and lateral ventricles, and basal ganglia are unremarkable in appearance. The cerebral hemispheres are symmetric in appearance, with normal gray-white differentiation. No mass effect or midline shift is seen.  There is no evidence of fracture; visualized osseous structures are unremarkable in appearance. The orbits are within normal limits. There is partial opacification of the left maxillary sinus. The remaining paranasal sinuses and mastoid air cells are well-aerated. No significant soft tissue abnormalities are seen.  IMPRESSION: 1. No acute intracranial pathology seen on CT. 2. Partial opacification of the left maxillary sinus.   Electronically Signed   By: Roanna RaiderJeffery  Chang M.D.   On: 02/05/2014 06:16   Dg Fluoro Guide Lumbar Puncture  02/05/2014    CLINICAL DATA:  Headache and fever.  EXAM: DIAGNOSTIC LUMBAR PUNCTURE UNDER FLUOROSCOPIC GUIDANCE  FLUOROSCOPY TIME:  0 min 24 seconds  PROCEDURE: Informed consent was obtained from the patient prior to the procedure, including potential complications of headache, allergy, and pain. With the patient prone, the lower back was prepped with Betadine. 1% Lidocaine was used for local anesthesia. Lumbar puncture was performed at the L4-5 level using a 20 gauge needle with return of clear colorless CSF with an opening pressure of 24 cm water. 28ml of CSF were obtained for laboratory studies. The patient tolerated the procedure well and there were no apparent complications.  IMPRESSION: Lumbar puncture performed without complication. The patient tolerated the procedure well.   Electronically Signed   By: Geanie CooleyJim  Maxwell M.D.   On:  02/05/2014 11:25    Microbiology: Recent Results (from the past 240 hour(s))  CULTURE, BLOOD (ROUTINE X 2)     Status: None   Collection Time    02/05/14  5:24 AM      Result Value Ref Range Status   Specimen Description Blood   Final   Special Requests NONE   Final   Culture     Final   Value: RIGHT HAND     10 CC EACH   Report Status PENDING   Incomplete  CULTURE, BLOOD (ROUTINE X 2)     Status: None   Collection Time    02/05/14  5:29 AM      Result Value Ref Range Status   Specimen Description Blood   Final   Special Requests NONE   Final   Culture     Final   Value: LEFT HAND     10 CC EACH   Report Status PENDING   Incomplete  CSF CULTURE     Status: None   Collection Time    02/05/14 10:35 AM      Result Value Ref Range Status   Specimen Description CSF   Final   Special Requests NONE   Final   Gram Stain     Final   Value: WBC PRESENT, PREDOMINANTLY MONONUCLEAR     NO ORGANISMS SEEN     CYTOSPIN Performed at [facility]     Performed at [facility]   Culture     Final   Value: NO GROWTH 1 DAY     Performed at [employer] [facility]    Report Status PENDING   Incomplete     Labs: Basic Metabolic Panel:  Recent Labs Lab 02/05/14 0524 02/06/14 0535  NA 135* 142  K 3.7 4.3  CL 97 107  CO2 22 22  GLUCOSE 141* 99  BUN 13 9  CREATININE 1.04 0.85  CALCIUM 8.7 8.5   Liver Function Tests:  Recent Labs Lab 02/05/14 0524 02/06/14 0535  AST 23 17  ALT 24 15  ALKPHOS 81 61  BILITOT 0.7 0.4  PROT 7.3 6.1  ALBUMIN 3.6 2.9*   No results found for this basename: LIPASE, AMYLASE,  in the last 168 hours No results found for this basename: AMMONIA,  in the last 168 hours CBC:  Recent Labs Lab 02/05/14 0524 02/06/14 0535  WBC 9.1 6.9  NEUTROABS 7.5  --   HGB 14.7 13.0  HCT 42.0 39.3  MCV 84.2 86.0  PLT 173 162   Cardiac Enzymes:  Recent Labs Lab 02/05/14 0524  TROPONINI <0.30   BNP: BNP (last 3 results) No results found for this basename: PROBNP,  in the last 8760 hours CBG: No results found for this basename: GLUCAP,  in the last 168 hours     Signed:  Elliot Cousin  [facility] 02/06/2014, 11:13 AM

## 2014-02-06 NOTE — Progress Notes (Signed)
Patient discharged home today with instructions given on medications,and follow up visits,patient verbalized understanding.Prescriptions sent with patient.Vital signs stable.Accompanied by staff to an awaiting vehicle.

## 2014-02-06 NOTE — Progress Notes (Signed)
UR chart review completed.  

## 2014-02-08 LAB — CSF CULTURE W GRAM STAIN: Culture: NO GROWTH

## 2014-02-09 LAB — VIRAL CULTURE VIRC: Culture: NOT DETECTED

## 2014-02-10 LAB — CULTURE, BLOOD (ROUTINE X 2)
Culture: NO GROWTH
Culture: NO GROWTH

## 2014-02-17 LAB — B. BURGDORFI ANTIBODIES, CSF: Lyme Ab: NEGATIVE

## 2015-12-29 IMAGING — CR DG CHEST 2V
2 series · 2 of 2 positions shown · non-contrast
Comparison: None.

CLINICAL DATA: Substernal chest pain x4 days, shortness of breath

EXAM:
CHEST  2 VIEW

[view not recorded (1 of 2)]
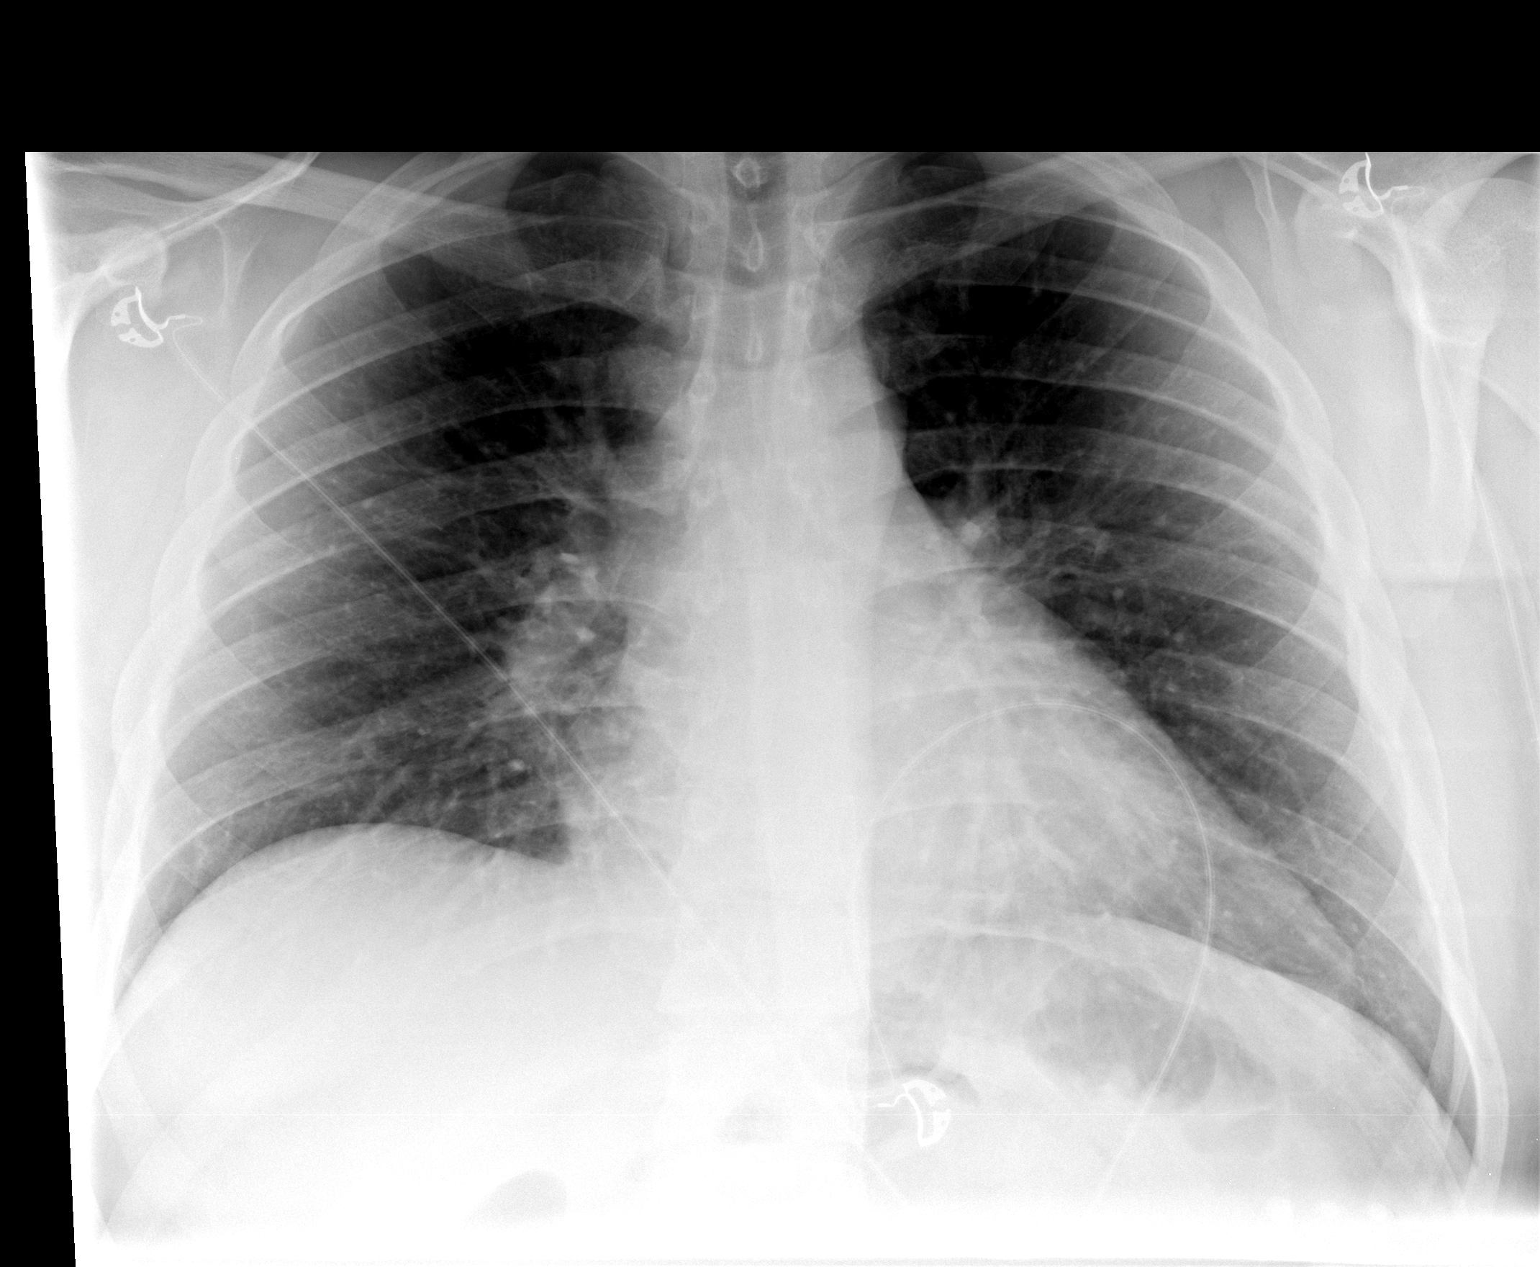

[view not recorded (2 of 2)]
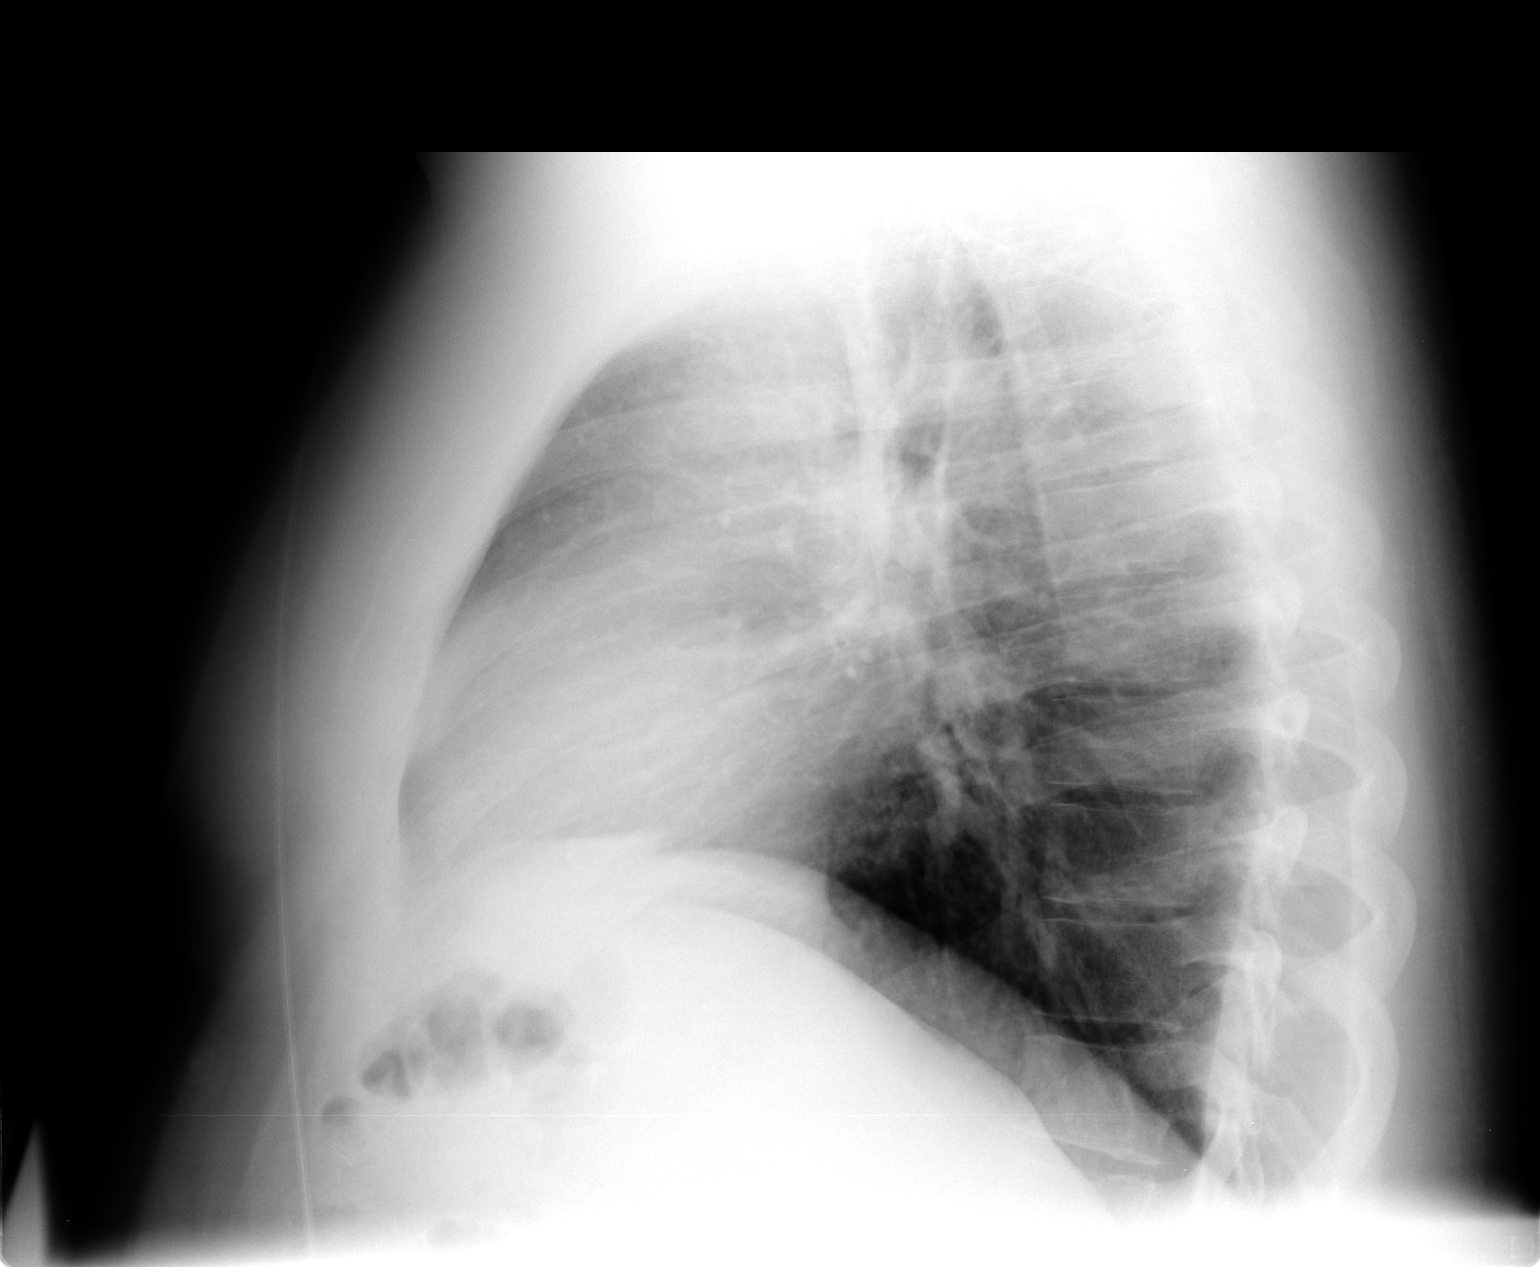

[2 of 2 positions shown; findings below may reference images not displayed]

FINDINGS: Lungs are clear.  No pleural effusion or pneumothorax.

The heart is normal in size.

Visualized osseous structures are within normal limits.
IMPRESSION: No evidence of acute cardiopulmonary disease.

## 2016-10-16 ENCOUNTER — Emergency Department (HOSPITAL_COMMUNITY)
Admission: EM | Admit: 2016-10-16 | Discharge: 2016-10-16 | Disposition: A | Discharge status: Home / Self Care | Payer: 59 | Attending: Emergency Medicine | Admitting: Emergency Medicine

## 2016-10-16 ENCOUNTER — Encounter (HOSPITAL_COMMUNITY): Payer: Self-pay | Admitting: Emergency Medicine

## 2016-10-16 DIAGNOSIS — Y9389 Activity, other specified: Secondary | ICD-10-CM | POA: Diagnosis not present

## 2016-10-16 DIAGNOSIS — Y92410 Unspecified street and highway as the place of occurrence of the external cause: Secondary | ICD-10-CM | POA: Diagnosis not present

## 2016-10-16 DIAGNOSIS — Z79899 Other long term (current) drug therapy: Secondary | ICD-10-CM | POA: Diagnosis not present

## 2016-10-16 DIAGNOSIS — Y999 Unspecified external cause status: Secondary | ICD-10-CM | POA: Diagnosis not present

## 2016-10-16 DIAGNOSIS — I10 Essential (primary) hypertension: Secondary | ICD-10-CM | POA: Diagnosis not present

## 2016-10-16 DIAGNOSIS — M545 Low back pain, unspecified: Secondary | ICD-10-CM

## 2016-10-16 DIAGNOSIS — S3992XA Unspecified injury of lower back, initial encounter: Secondary | ICD-10-CM | POA: Insufficient documentation
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
## 2016-10-16 NOTE — Discharge Instructions (Signed)
Please return for worsening symptoms °

## 2017-04-19 ENCOUNTER — Encounter (INDEPENDENT_AMBULATORY_CARE_PROVIDER_SITE_OTHER): Payer: Self-pay | Admitting: Internal Medicine

## 2017-04-19 ENCOUNTER — Encounter (INDEPENDENT_AMBULATORY_CARE_PROVIDER_SITE_OTHER): Payer: Self-pay

## 2017-05-08 ENCOUNTER — Ambulatory Visit (INDEPENDENT_AMBULATORY_CARE_PROVIDER_SITE_OTHER): Payer: 59 | Admitting: Internal Medicine

## 2017-06-01 ENCOUNTER — Ambulatory Visit (INDEPENDENT_AMBULATORY_CARE_PROVIDER_SITE_OTHER): Payer: 59 | Admitting: Internal Medicine

## 2018-01-03 ENCOUNTER — Encounter (HOSPITAL_COMMUNITY): Payer: Self-pay

## 2018-01-03 DIAGNOSIS — K0889 Other specified disorders of teeth and supporting structures: Secondary | ICD-10-CM | POA: Insufficient documentation

## 2018-01-03 DIAGNOSIS — I10 Essential (primary) hypertension: Secondary | ICD-10-CM | POA: Insufficient documentation

## 2018-01-03 DIAGNOSIS — Z79899 Other long term (current) drug therapy: Secondary | ICD-10-CM | POA: Diagnosis not present

## 2018-01-03 NOTE — ED Triage Notes (Signed)
Sudden onset of left upper tooth pain started approx 2130, has taken ibuprofen and oxycodone without relief.  Pt denies recent injury or problem with the tooth.

## 2018-01-04 ENCOUNTER — Emergency Department (HOSPITAL_COMMUNITY)
Admission: EM | Admit: 2018-01-04 | Discharge: 2018-01-04 | Disposition: A | Discharge status: Home / Self Care | Payer: 59 | Attending: Emergency Medicine | Admitting: Emergency Medicine

## 2018-01-04 DIAGNOSIS — K0889 Other specified disorders of teeth and supporting structures: Secondary | ICD-10-CM

## 2018-01-04 MED ORDER — PENICILLIN V POTASSIUM 250 MG PO TABS
500.0000 mg | ORAL_TABLET | Freq: Once | ORAL | Status: AC
  Administered 2018-01-04: 500 mg via ORAL
  Filled 2018-01-04: qty 2

## 2018-01-04 MED ORDER — OXYCODONE-ACETAMINOPHEN 5-325 MG PO TABS: 1.0000 | ORAL_TABLET | ORAL | 0 refills | Status: AC | PRN

## 2018-01-04 MED ORDER — OXYCODONE-ACETAMINOPHEN 5-325 MG PO TABS
1.0000 | ORAL_TABLET | Freq: Once | ORAL | Status: AC
  Administered 2018-01-04: 1 via ORAL
  Filled 2018-01-04: qty 1

## 2018-01-04 MED ORDER — PENICILLIN V POTASSIUM 500 MG PO TABS: 500.0000 mg | ORAL_TABLET | Freq: Three times a day (TID) | ORAL | 0 refills | Status: AC

## 2018-01-04 NOTE — ED Provider Notes (Signed)
[facility] EMERGENCY DEPARTMENT Provider Note   CSN: [id number] Arrival date & time: 01/03/18  2327     History   Chief Complaint Chief Complaint  Patient presents with  . Dental Pain    HPI Stuart Cook is a 32 y.o. male.  HPI   Stuart Cook is a 32 y.o. male who presents to the Emergency Department complaining of left upper dental pain that began at 930 pm.  Pain began after eating.  He describes a sharp constant pain to his left upper tooth that radiates into his face and toward his left ear.  Pain is worse with chewing and with sensation of cold air.  He is taken 600 mg ibuprofen and one 5 mg hydrocodone without relief.  He states that he contacted his dentist office but did not receive a call back.  He denies fever, neck pain, facial swelling, difficulty swallowing or breathing.    Past Medical History:  Diagnosis Date  . GERD (gastroesophageal reflux disease)   . Hypertension    Borderline and treated with diet alone.  . Lyme disease   . RMS[facility]([place] spotted fever)   . Seasonal allergies     Patient Active Problem List   Diagnosis Date Noted  . Encephalopathy acute 02/05/2014  . At high risk for tick borne illness 02/05/2014  . Hypoxia 02/05/2014  . Sepsis (HCC) 02/05/2014  . Headache(784.0) 02/05/2014  . Nausea with vomiting 02/05/2014  . Left maxillary sinusitis 02/05/2014    Past Surgical History:  Procedure Laterality Date  . ESOPHAGOGASTRODUODENOSCOPY ENDOSCOPY    . KNEE SURGERY     right        Home Medications    Prior to Admission medications   Medication Sig Start Date End Date Taking? Authorizing Provider  Chlorpheniramine Maleate (ALLERGY PO) Take 1 tablet by mouth daily.    [provider]  doxycycline (VIBRA-TABS) 100 MG tablet Take 1 tablet (100 mg total) by mouth 2 (two) times daily. Take this medication until completely finished. 02/06/14   Elliot CousinFisher, Denise, MD  fluticasone Aleda Grana(FLONASE) 50 MCG/ACT nasal spray  Place 1 spray into both nostrils every morning. 12/09/13   [provider]  HYDROcodone-acetaminophen (NORCO/VICODIN) 5-325 MG per tablet Take 1 tablet by mouth every 6 (six) hours as needed for moderate pain. 02/06/14   Elliot CousinFisher, Denise, MD  ibuprofen (ADVIL,MOTRIN) 200 MG tablet Take 400 mg by mouth every 6 (six) hours as needed for mild pain.    [provider]  omeprazole (PRILOSEC) 20 MG capsule Take 20 mg by mouth once as needed.    [provider]    Family History No family history on file.  Social History Social History   Tobacco Use  . Smoking status: Never Smoker  . Smokeless tobacco: Never Used  Substance Use Topics  . Alcohol use: No  . Drug use: No     Allergies   Patient has no known allergies.   Review of Systems Review of Systems  Constitutional: Negative for appetite change and fever.  HENT: Positive for dental problem. Negative for congestion, facial swelling, sore throat and trouble swallowing.   Eyes: Negative for pain and visual disturbance.  Gastrointestinal: Negative for abdominal pain, nausea and vomiting.  Musculoskeletal: Negative for neck pain and neck stiffness.  Neurological: Negative for dizziness, facial asymmetry and headaches.  Hematological: Negative for adenopathy.  All other systems reviewed and are negative.    Physical Exam Updated Vital Signs BP (!) 144/97 (BP Location: Right  Arm)   Pulse 62   Temp 97.8 F (36.6 C) (Oral)   Resp 18   Ht 6\' 1"  (1.854 m)   Wt 104.3 kg (230 lb)   SpO2 100%   BMI 30.34 kg/m   Physical Exam  Constitutional: He is oriented to person, place, and time. He appears well-developed and well-nourished. No distress.  HENT:  Head: Normocephalic and atraumatic.  Right Ear: Tympanic membrane and ear canal normal.  Left Ear: Tympanic membrane and ear canal normal.  Mouth/Throat: Uvula is midline, oropharynx is clear and moist and mucous membranes are normal. No trismus in the jaw.  Dental caries present. No dental abscesses or uvula swelling.  ttp and dental caries of the left upper second molar.  No facial swelling, obvious dental abscess, trismus, or sublingual abnml.    Neck: Normal range of motion. Neck supple.  Cardiovascular: Normal rate and regular rhythm.  No murmur heard. Pulmonary/Chest: Effort normal and breath sounds normal.  Musculoskeletal: Normal range of motion.  Lymphadenopathy:    He has no cervical adenopathy.  Neurological: He is alert and oriented to person, place, and time. He exhibits normal muscle tone. Coordination normal.  Skin: Skin is warm and dry.  Nursing note and vitals reviewed.    ED Treatments / Results  Labs (all labs ordered are listed, but only abnormal results are displayed) Labs Reviewed - No data to display  EKG None  Radiology No results found.  Procedures Procedures (including critical care time)  Medications Ordered in ED Medications - No data to display   Initial Impression / Assessment and Plan / ED Course  I have reviewed the triage vital signs and the nursing notes.  Pertinent labs & imaging results that were available during my care of the patient were reviewed by me and considered in my medical decision making (see chart for details).     Patient is well-appearing.  Airway patent.  Vitals reviewed.  Dental decay without obvious abscess.  No concerning symptoms for Ludewig's angina.  Patient does have a dentist and agrees to close follow-up.   Final Clinical Impressions(s) / ED Diagnoses   Final diagnoses:  Pain, dental    ED Discharge Orders    None       Pauline Aus, PA-C 01/04/18 0053    Geoffery Lyons, MD 01/04/18 [phone]

## 2018-01-04 NOTE — Discharge Instructions (Addendum)
Continue taking ibuprofen 600 to 800 mg every 6-8 hours as needed for pain.  Call your dentist tomorrow to arrange a follow-up appointment.  Return to the ER for any worsening symptoms such as fever, vomiting, difficulty swallowing or breathing or significant swelling of your face.

## 2018-01-07 MED FILL — Oxycodone w/ Acetaminophen Tab 5-325 MG: ORAL | Qty: 6 | Status: AC

## 2019-03-05 ENCOUNTER — Other Ambulatory Visit: Payer: Self-pay | Admitting: Internal Medicine

## 2019-03-05 ENCOUNTER — Other Ambulatory Visit: Payer: 59

## 2019-03-05 DIAGNOSIS — Z20822 Contact with and (suspected) exposure to covid-19: Secondary | ICD-10-CM

## 2019-03-05 NOTE — Progress Notes (Signed)
[id number] 

## 2019-03-09 LAB — NOVEL CORONAVIRUS, NAA: SARS-CoV-2, NAA: NOT DETECTED

## 2019-05-03 ENCOUNTER — Other Ambulatory Visit: Payer: Self-pay

## 2019-05-03 ENCOUNTER — Encounter (HOSPITAL_COMMUNITY): Payer: Self-pay | Admitting: Emergency Medicine

## 2019-05-03 ENCOUNTER — Emergency Department (HOSPITAL_COMMUNITY): Payer: 59

## 2019-05-03 ENCOUNTER — Emergency Department (HOSPITAL_COMMUNITY)
Admission: EM | Admit: 2019-05-03 | Discharge: 2019-05-04 | Disposition: A | Discharge status: Home / Self Care | Payer: 59 | Attending: Emergency Medicine | Admitting: Emergency Medicine

## 2019-05-03 DIAGNOSIS — R319 Hematuria, unspecified: Secondary | ICD-10-CM | POA: Diagnosis present

## 2019-05-03 HISTORY — DX: Post-traumatic stress disorder, unspecified: F43.10

## 2019-05-03 LAB — URINALYSIS, ROUTINE W REFLEX MICROSCOPIC
Bacteria, UA: NONE SEEN
Bilirubin Urine: NEGATIVE
Glucose, UA: NEGATIVE mg/dL
Ketones, ur: NEGATIVE mg/dL
Leukocytes,Ua: NEGATIVE
Nitrite: NEGATIVE
Protein, ur: NEGATIVE mg/dL
RBC / HPF: >50 RBC/hpf — ABNORMAL HIGH (ref 0–5)
Specific Gravity, Urine: 1.021 (ref 1.005–1.030)
pH: 6 (ref 5.0–8.0)
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
## 2019-05-03 NOTE — ED Triage Notes (Signed)
Pressure while urinating,  Blood in urine   Now with flank pain   Never experienced this before

## 2019-05-04 LAB — BASIC METABOLIC PANEL
Anion gap: 8 (ref 5–15)
BUN: 15 mg/dL (ref 6–20)
CO2: 26 mmol/L (ref 22–32)
Calcium: 8.6 mg/dL — ABNORMAL LOW (ref 8.9–10.3)
Chloride: 104 mmol/L (ref 98–111)
Creatinine, Ser: 1.01 mg/dL (ref 0.61–1.24)
GFR calc Af Amer: >60 mL/min (ref >60)
GFR calc non Af Amer: >60 mL/min (ref >60)
Glucose, Bld: 116 mg/dL — ABNORMAL HIGH (ref 70–99)
Potassium: 3.6 mmol/L (ref 3.5–5.1)
Sodium: 138 mmol/L (ref 135–145)

## 2019-05-04 LAB — CK: Total CK: 66 U/L (ref 49–397)

## 2019-05-04 LAB — CBC
HCT: 45 % (ref 39.0–52.0)
Hemoglobin: 14.8 g/dL (ref 13.0–17.0)
MCH: 28.9 pg (ref 26.0–34.0)
MCHC: 32.9 g/dL (ref 30.0–36.0)
MCV: 87.9 fL (ref 80.0–100.0)
Platelets: 239 10*3/uL (ref 150–400)
RBC: 5.12 MIL/uL (ref 4.22–5.81)
RDW: 12.6 % (ref 11.5–15.5)
WBC: 10.4 10*3/uL (ref 4.0–10.5)
nRBC: 0 % (ref 0.0–0.2)

## 2019-05-04 MED ORDER — ONDANSETRON 4 MG PO TBDP
4.0000 mg | ORAL_TABLET | Freq: Once | ORAL | Status: AC
  Administered 2019-05-04: 4 mg via ORAL
  Filled 2019-05-04: qty 1

## 2019-05-04 MED ORDER — HYDROCODONE-ACETAMINOPHEN 5-325 MG PO TABS
1.0000 | ORAL_TABLET | Freq: Once | ORAL | Status: AC
  Administered 2019-05-04: 1 via ORAL
  Filled 2019-05-04: qty 1

## 2019-05-04 MED ORDER — CEPHALEXIN 500 MG PO CAPS: 500.0000 mg | ORAL_CAPSULE | Freq: Four times a day (QID) | ORAL | 0 refills | Status: AC

## 2019-05-04 MED ORDER — NAPROXEN 500 MG PO TABS: 500.0000 mg | ORAL_TABLET | Freq: Two times a day (BID) | ORAL | 0 refills | Status: AC

## 2019-05-04 MED ORDER — ONDANSETRON 4 MG PO TBDP: 4.0000 mg | ORAL_TABLET | Freq: Three times a day (TID) | ORAL | 0 refills | Status: AC | PRN

## 2019-05-04 NOTE — Discharge Instructions (Signed)
As we discussed, you may have passed a kidney stone.  Take the antibiotics as prescribed while the culture is pending.  Follow-up with the urologist.  Return to the ED if you have worsening pain, testicular pain, unable to urinate, fever, vomiting, any other concerns.

## 2019-05-05 LAB — URINE CULTURE: Culture: NO GROWTH

## 2021-01-28 ENCOUNTER — Encounter (HOSPITAL_COMMUNITY): Payer: Self-pay

## 2021-01-28 ENCOUNTER — Emergency Department (HOSPITAL_COMMUNITY)
Admission: EM | Admit: 2021-01-28 | Discharge: 2021-01-28 | Disposition: A | Discharge status: Home / Self Care | Payer: BC Managed Care – PPO | Attending: Emergency Medicine | Admitting: Emergency Medicine

## 2021-01-28 ENCOUNTER — Emergency Department (HOSPITAL_COMMUNITY): Payer: BC Managed Care – PPO

## 2021-01-28 ENCOUNTER — Other Ambulatory Visit: Payer: Self-pay

## 2021-01-28 DIAGNOSIS — J441 Chronic obstructive pulmonary disease with (acute) exacerbation: Secondary | ICD-10-CM | POA: Diagnosis not present

## 2021-01-28 DIAGNOSIS — I10 Essential (primary) hypertension: Secondary | ICD-10-CM | POA: Insufficient documentation

## 2021-01-28 DIAGNOSIS — Z79899 Other long term (current) drug therapy: Secondary | ICD-10-CM | POA: Diagnosis not present

## 2021-01-28 DIAGNOSIS — Z20822 Contact with and (suspected) exposure to covid-19: Secondary | ICD-10-CM | POA: Insufficient documentation

## 2021-01-28 DIAGNOSIS — R0602 Shortness of breath: Secondary | ICD-10-CM | POA: Diagnosis present

## 2021-01-28 LAB — COMPREHENSIVE METABOLIC PANEL
ALT: 24 U/L (ref 0–44)
AST: 21 U/L (ref 15–41)
Albumin: 3.8 g/dL (ref 3.5–5.0)
Alkaline Phosphatase: 84 U/L (ref 38–126)
Anion gap: 8 (ref 5–15)
BUN: 18 mg/dL (ref 6–20)
CO2: 25 mmol/L (ref 22–32)
Calcium: 8.8 mg/dL — ABNORMAL LOW (ref 8.9–10.3)
Chloride: 105 mmol/L (ref 98–111)
Creatinine, Ser: 1.04 mg/dL (ref 0.61–1.24)
GFR, Estimated: >60 mL/min (ref >60)
Glucose, Bld: 130 mg/dL — ABNORMAL HIGH (ref 70–99)
Potassium: 3.4 mmol/L — ABNORMAL LOW (ref 3.5–5.1)
Sodium: 138 mmol/L (ref 135–145)
Total Bilirubin: 0.7 mg/dL (ref 0.3–1.2)
Total Protein: 7.1 g/dL (ref 6.5–8.1)

## 2021-01-28 LAB — CBC WITH DIFFERENTIAL/PLATELET
Abs Immature Granulocytes: 0.04 10*3/uL (ref 0.00–0.07)
Basophils Absolute: 0.1 10*3/uL (ref 0.0–0.1)
Basophils Relative: 1 %
Eosinophils Absolute: 0.8 10*3/uL — ABNORMAL HIGH (ref 0.0–0.5)
Eosinophils Relative: 7 %
HCT: 45.3 % (ref 39.0–52.0)
Hemoglobin: 15.3 g/dL (ref 13.0–17.0)
Immature Granulocytes: 0 %
Lymphocytes Relative: 21 %
Lymphs Abs: 2.4 10*3/uL (ref 0.7–4.0)
MCH: 29.5 pg (ref 26.0–34.0)
MCHC: 33.8 g/dL (ref 30.0–36.0)
MCV: 87.3 fL (ref 80.0–100.0)
Monocytes Absolute: 0.7 10*3/uL (ref 0.1–1.0)
Monocytes Relative: 6 %
Neutro Abs: 7.4 10*3/uL (ref 1.7–7.7)
Neutrophils Relative %: 65 %
Platelets: 233 10*3/uL (ref 150–400)
RBC: 5.19 MIL/uL (ref 4.22–5.81)
RDW: 13.2 % (ref 11.5–15.5)
WBC: 11.3 10*3/uL — ABNORMAL HIGH (ref 4.0–10.5)
nRBC: 0 % (ref 0.0–0.2)

## 2021-01-28 LAB — BRAIN NATRIURETIC PEPTIDE: B Natriuretic Peptide: 19 pg/mL (ref 0.0–100.0)

## 2021-01-28 LAB — RESP PANEL BY RT-PCR (FLU A&B, COVID) ARPGX2
Influenza A by PCR: NEGATIVE
Influenza B by PCR: NEGATIVE
SARS Coronavirus 2 by RT PCR: NEGATIVE

## 2021-01-28 MED ORDER — DOXYCYCLINE HYCLATE 100 MG PO CAPS: 100.0000 mg | ORAL_CAPSULE | Freq: Two times a day (BID) | ORAL | 0 refills | Status: AC

## 2021-01-28 MED ORDER — ALBUTEROL SULFATE HFA 108 (90 BASE) MCG/ACT IN AERS
2.0000 | INHALATION_SPRAY | RESPIRATORY_TRACT | Status: DC | PRN
  Administered 2021-01-28: 2 via RESPIRATORY_TRACT
  Filled 2021-01-28: qty 6.7

## 2021-01-28 MED ORDER — PREDNISONE 20 MG PO TABS: ORAL_TABLET | ORAL | 0 refills | Status: AC

## 2021-01-28 MED ORDER — PREDNISONE 50 MG PO TABS
60.0000 mg | ORAL_TABLET | Freq: Once | ORAL | Status: AC
  Administered 2021-01-28: 60 mg via ORAL
  Filled 2021-01-28: qty 1

## 2021-01-28 NOTE — ED Provider Notes (Signed)
[facility] EMERGENCY DEPARTMENT Provider Note   CSN: [id number] Arrival date & time: 01/28/21  2021     History Chief Complaint  Patient presents with  . Shortness of Breath    Stuart Cook is a 35 y.o. male.  Patient complains of cough and shortness of breath.  No sputum production.  No fever no chills  The history is provided by the patient and medical records. No language interpreter was used.  Shortness of Breath Severity:  Moderate Onset quality:  Sudden Timing:  Constant Progression:  Worsening Chronicity:  New Context: not activity   Relieved by:  Nothing Worsened by:  Nothing Ineffective treatments:  None tried Associated symptoms: no abdominal pain, no chest pain, no cough, no headaches and no rash        Past Medical History:  Diagnosis Date  . GERD (gastroesophageal reflux disease)   . Hypertension    Borderline and treated with diet alone.  . Lyme disease   . PTSD (post-traumatic stress disorder)    from [place]  . RMSF [facility] spotted fever)   . Seasonal allergies     Patient Active Problem List   Diagnosis Date Noted  . Encephalopathy acute 02/05/2014  . At high risk for tick borne illness 02/05/2014  . Hypoxia 02/05/2014  . Sepsis (HCC) 02/05/2014  . Headache(784.0) 02/05/2014  . Nausea with vomiting 02/05/2014  . Left maxillary sinusitis 02/05/2014    Past Surgical History:  Procedure Laterality Date  . ESOPHAGOGASTRODUODENOSCOPY ENDOSCOPY    . KNEE SURGERY     right       History reviewed. No pertinent family history.  Social History   Tobacco Use  . Smoking status: Never Smoker  . Smokeless tobacco: Never Used  Substance Use Topics  . Alcohol use: No  . Drug use: No    Home Medications Prior to Admission medications   Medication Sig Start Date End Date Taking? Authorizing Provider  doxycycline (VIBRAMYCIN) 100 MG capsule Take 1 capsule (100 mg total) by mouth 2 (two) times daily. One po bid x 7 days 01/28/21   Yes Bethann Berkshire, MD  predniSONE (DELTASONE) 20 MG tablet 2 tabs po daily x 3 days 01/28/21  Yes Bethann Berkshire, MD  cephALEXin (KEFLEX) 500 MG capsule Take 1 capsule (500 mg total) by mouth 4 (four) times daily. 05/04/19   Rancour, Jeannett Senior, MD  Chlorpheniramine Maleate (ALLERGY PO) Take 1 tablet by mouth daily.    [provider]  fluticasone (FLONASE) 50 MCG/ACT nasal spray Place 1 spray into both nostrils every morning. 12/09/13   [provider]  HYDROcodone-acetaminophen (NORCO/VICODIN) 5-325 MG per tablet Take 1 tablet by mouth every 6 (six) hours as needed for moderate pain. 02/06/14   Elliot Cousin, MD  ibuprofen (ADVIL,MOTRIN) 200 MG tablet Take 400 mg by mouth every 6 (six) hours as needed for mild pain.    [provider]  naproxen (NAPROSYN) 500 MG tablet Take 1 tablet (500 mg total) by mouth 2 (two) times daily. 05/04/19   Rancour, Jeannett Senior, MD  omeprazole (PRILOSEC) 20 MG capsule Take 20 mg by mouth once as needed.    [provider]  ondansetron (ZOFRAN ODT) 4 MG disintegrating tablet Take 1 tablet (4 mg total) by mouth every 8 (eight) hours as needed for nausea or vomiting. 05/04/19   Rancour, Jeannett Senior, MD  oxyCODONE-acetaminophen (PERCOCET/ROXICET) 5-325 MG tablet Take 1 tablet by mouth every 4 (four) hours as needed. 01/04/18   Triplett, Tammy, PA-C  penicillin v  potassium (VEETID) 500 MG tablet Take 1 tablet (500 mg total) by mouth 3 (three) times daily. 01/04/18   Triplett, Tammy, PA-C    Allergies    Patient has no known allergies.  Review of Systems   Review of Systems  Constitutional: Negative for appetite change and fatigue.  HENT: Negative for congestion, ear discharge and sinus pressure.   Eyes: Negative for discharge.  Respiratory: Positive for shortness of breath. Negative for cough.   Cardiovascular: Negative for chest pain.  Gastrointestinal: Negative for abdominal pain and diarrhea.  Genitourinary: Negative for frequency and  hematuria.  Musculoskeletal: Negative for back pain.  Skin: Negative for rash.  Neurological: Negative for seizures and headaches.  Psychiatric/Behavioral: Negative for hallucinations.    Physical Exam Updated Vital Signs BP 134/83   Pulse 86   Temp 98.9 F (37.2 C) (Oral)   Resp 18   Ht 6\' 1"  (1.854 m)   Wt 113.4 kg   SpO2 93%   BMI 32.98 kg/m   Physical Exam Vitals and nursing note reviewed.  Constitutional:      Appearance: He is well-developed.  HENT:     Head: Normocephalic.     Nose: Nose normal.  Eyes:     General: No scleral icterus.    Conjunctiva/sclera: Conjunctivae normal.  Neck:     Thyroid: No thyromegaly.  Cardiovascular:     Rate and Rhythm: Normal rate and regular rhythm.     Heart sounds: No murmur heard. No friction rub. No gallop.   Pulmonary:     Breath sounds: No stridor. No wheezing or rales.  Chest:     Chest wall: No tenderness.  Abdominal:     General: There is no distension.     Tenderness: There is no abdominal tenderness. There is no rebound.  Musculoskeletal:        General: Normal range of motion.     Cervical back: Neck supple.  Lymphadenopathy:     Cervical: No cervical adenopathy.  Skin:    Findings: No erythema or rash.  Neurological:     Mental Status: He is alert and oriented to person, place, and time.     Motor: No abnormal muscle tone.     Coordination: Coordination normal.  Psychiatric:        Behavior: Behavior normal.     ED Results / Procedures / Treatments   Labs (all labs ordered are listed, but only abnormal results are displayed) Labs Reviewed  CBC WITH DIFFERENTIAL/PLATELET - Abnormal; Notable for the following components:      Result Value   WBC 11.3 (*)    Eosinophils Absolute 0.8 (*)    All other components within normal limits  COMPREHENSIVE METABOLIC PANEL - Abnormal; Notable for the following components:   Potassium 3.4 (*)    Glucose, Bld 130 (*)    Calcium 8.8 (*)    All other components  within normal limits  RESP PANEL BY RT-PCR (FLU A&B, COVID) ARPGX2  BRAIN NATRIURETIC PEPTIDE    EKG EKG Interpretation  Date/Time:  Friday Jan 28 2021 20:47:16 EDT Ventricular Rate:  90 PR Interval:  142 QRS Duration: 91 QT Interval:  347 QTC Calculation: 425 R Axis:   20 Text Interpretation: Sinus rhythm Baseline wander in lead(s) V1 Confirmed by 05-26-1991 [phone]) on 01/28/2021 9:03:20 PM   Radiology DG Chest Port 1 View  Result Date: 01/28/2021 CLINICAL DATA:  Shortness of breath, cough. EXAM: PORTABLE CHEST 1 VIEW COMPARISON:  Feb 05, 2014. FINDINGS:  The heart size and mediastinal contours are within normal limits. Both lungs are clear. The visualized skeletal structures are unremarkable. IMPRESSION: No active disease. Electronically Signed   By: Lupita Raider M.D.   On: 01/28/2021 21:21    Procedures Procedures   Medications Ordered in ED Medications  albuterol (VENTOLIN HFA) 108 (90 Base) MCG/ACT inhaler 2 puff (2 puffs Inhalation Given 01/28/21 2132)  predniSONE (DELTASONE) tablet 60 mg (has no administration in time range)    ED Course  I have reviewed the triage vital signs and the nursing notes.  Pertinent labs & imaging results that were available during my care of the patient were reviewed by me and considered in my medical decision making (see chart for details).    MDM Rules/Calculators/A&P                          Patient with bronchitis and bronchospasm.  He will be placed on prednisone and doxycycline follow-up with his doctor.  Patient also given albuterol inhaler Final Clinical Impression(s) / ED Diagnoses Final diagnoses:  COPD exacerbation (HCC)    Rx / DC Orders ED Discharge Orders         Ordered    predniSONE (DELTASONE) 20 MG tablet        01/28/21 2223    doxycycline (VIBRAMYCIN) 100 MG capsule  2 times daily        01/28/21 2223           Bethann Berkshire, MD 01/30/21 1112

## 2021-01-28 NOTE — Discharge Instructions (Addendum)
Use the inhaler every 4-6 hours for shortness of breath and follow-up with your doctor next week if not improving.  Return if problem

## 2021-01-28 NOTE — ED Triage Notes (Signed)
Pt c/o of SOB and cough since yesterday.

## 2021-04-18 IMAGING — CT CT RENAL STONE PROTOCOL
2 of 4 series · 16 of 46 positions shown, 18 images · non-contrast
Comparison: None.

CLINICAL DATA: Bilateral lower abdominal flank pain.

EXAM:
CT ABDOMEN AND PELVIS WITHOUT CONTRAST
TECHNIQUE: Multidetector CT imaging of the abdomen and pelvis was performed
following the standard protocol without IV contrast.

[Series 2: axial st · axial · 0.98mm/px · z∈[-833,-298]mm · 13 of 121 slices shown, 15 images]
[im 7/121  soft-tissue]
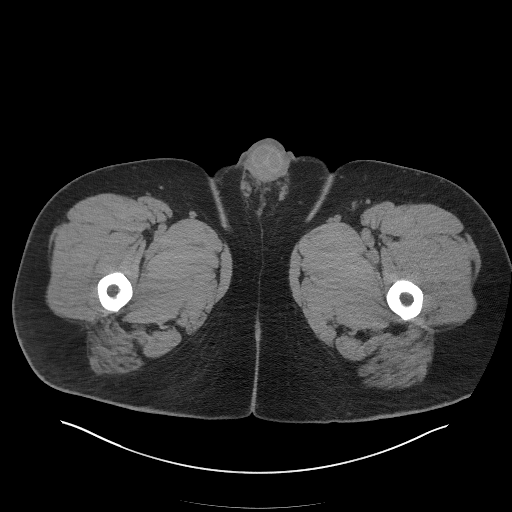
[im 7/121  bone]
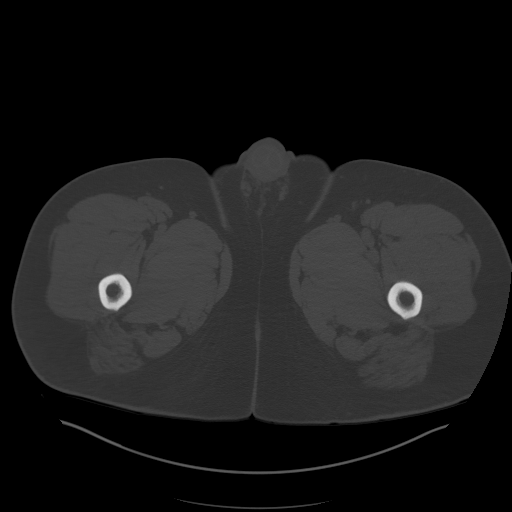
[im 14/121  soft-tissue]
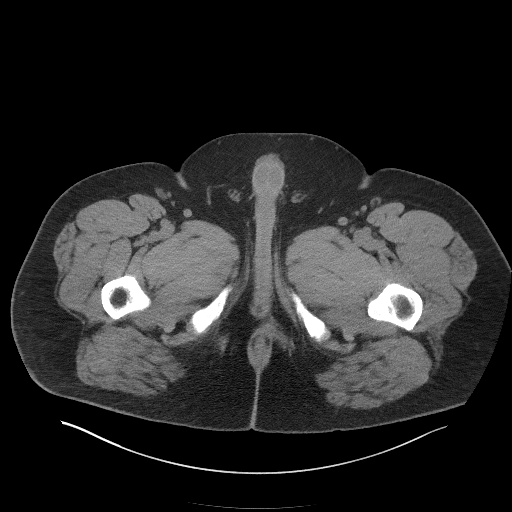
[im 27/121  soft-tissue]
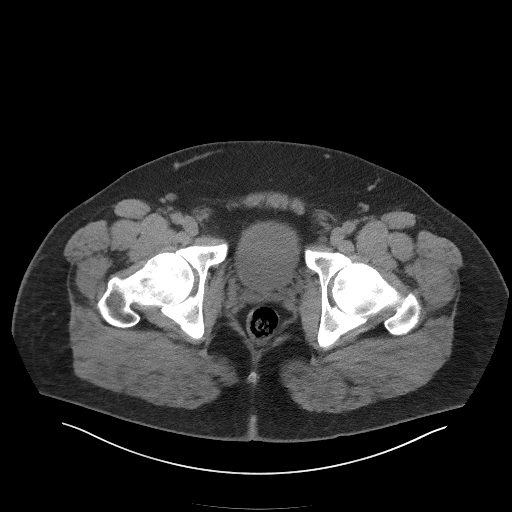
[im 34/121  soft-tissue]
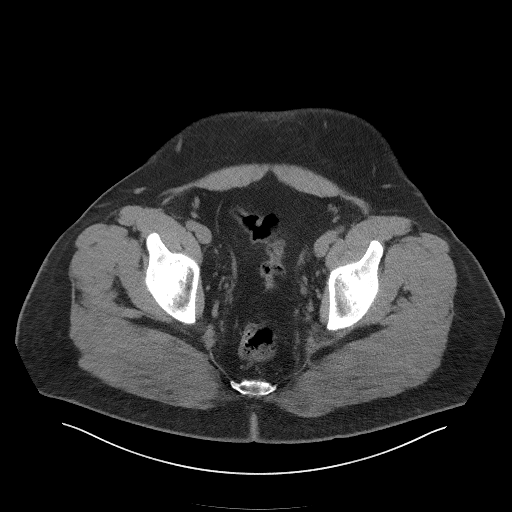
[im 41/121  soft-tissue]
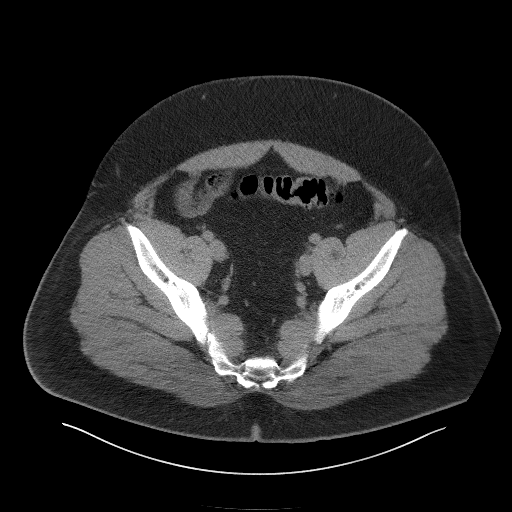
[im 54/121  soft-tissue]
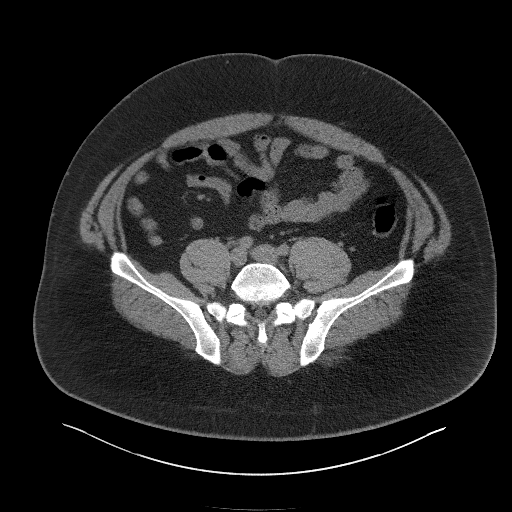
[im 61/121  soft-tissue]
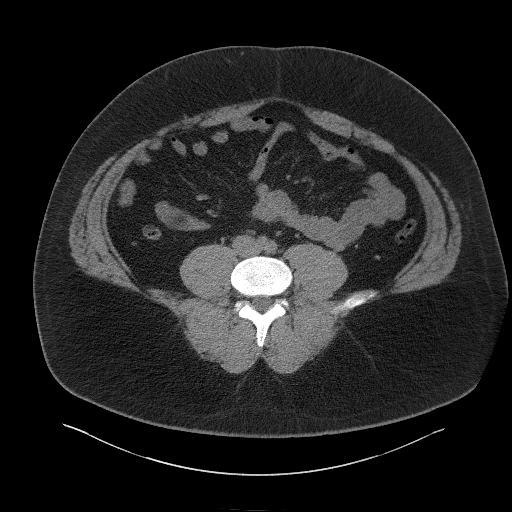
[im 67/121  soft-tissue]
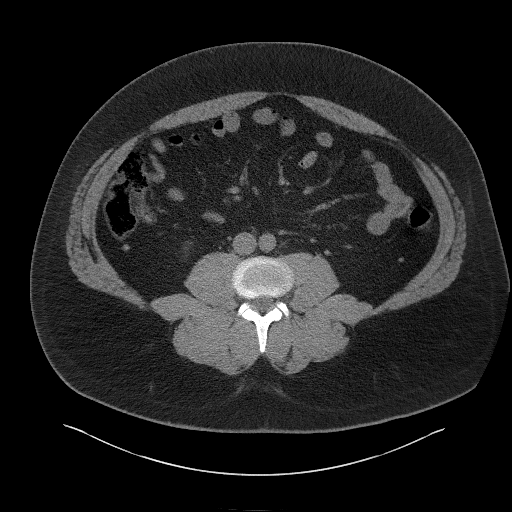
[im 81/121  soft-tissue]
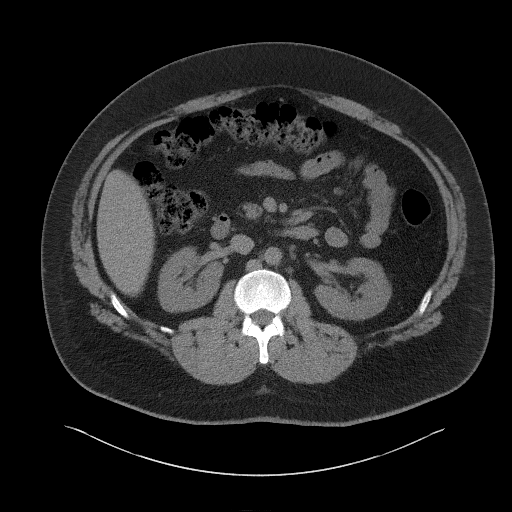
[im 81/121  bone]
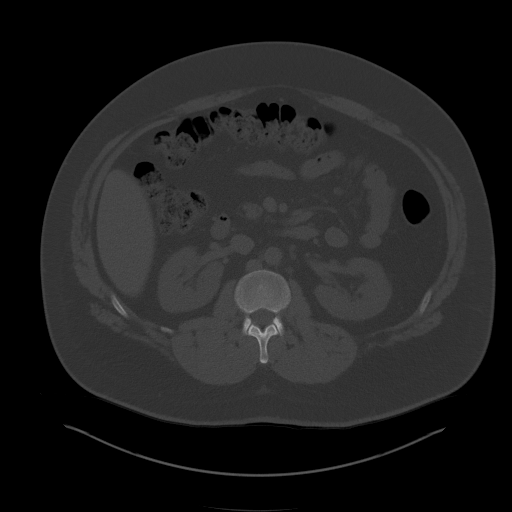
[im 87/121  soft-tissue]
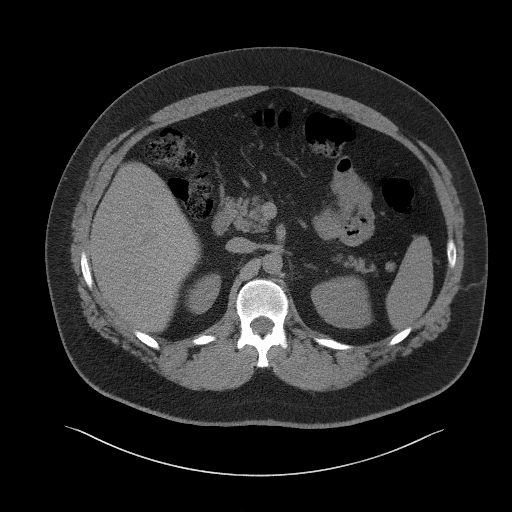
[im 94/121  soft-tissue]
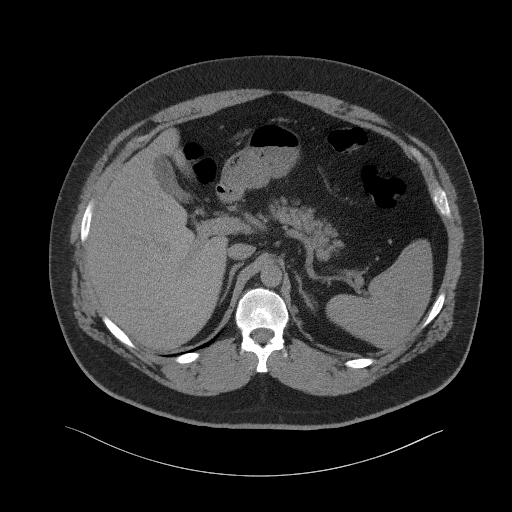
[im 107/121  soft-tissue]
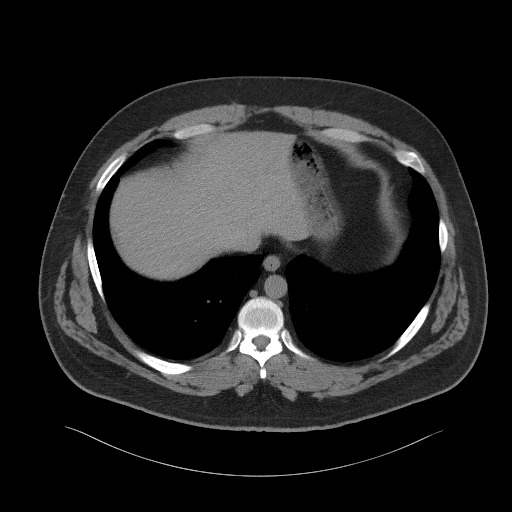
[im 114/121  soft-tissue]
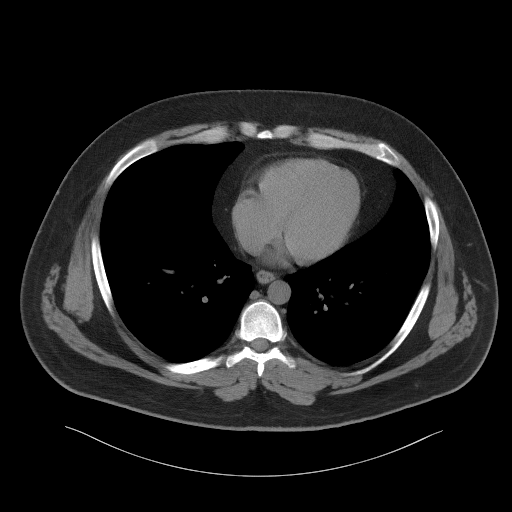

[Series 5: coronal st · coronal · 1.01mm/px · 3 of 137 slices shown]
[im 46/137  soft-tissue]
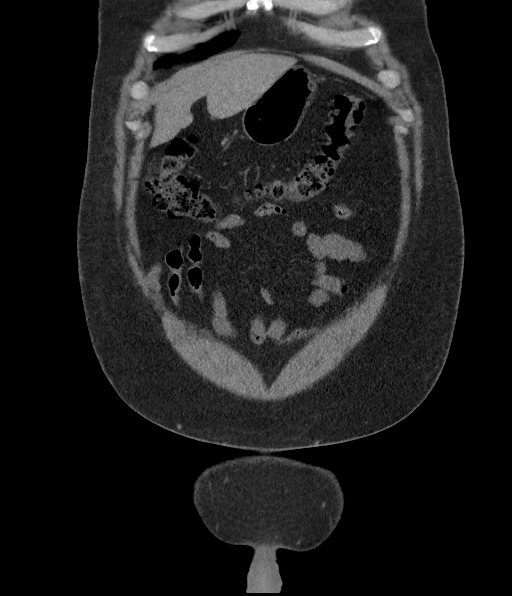
[im 61/137  soft-tissue]
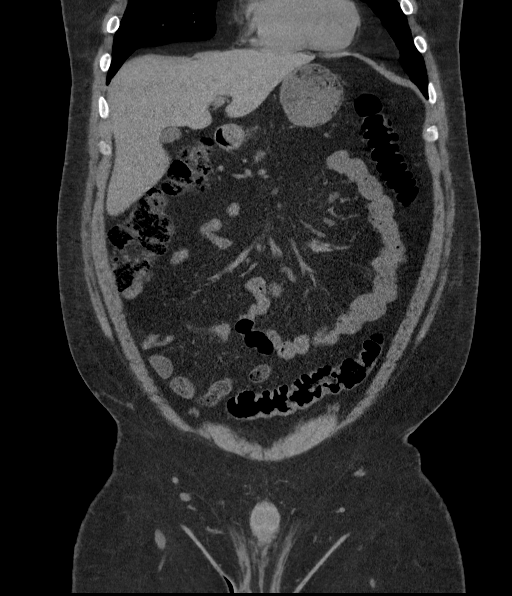
[im 76/137  soft-tissue]
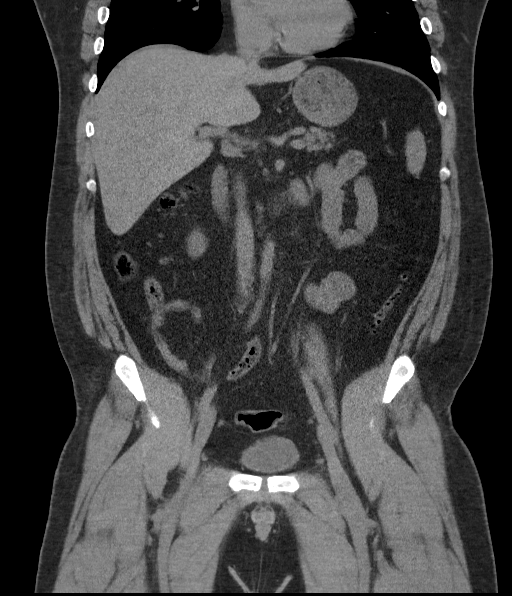

[16 of 46 positions shown; findings below may reference images not displayed]

FINDINGS: Lower chest: The lung bases are clear. The heart size is normal.

Hepatobiliary: The liver is normal. Normal gallbladder.There is no
biliary ductal dilation.

Pancreas: Normal contours without ductal dilatation. No
peripancreatic fluid collection.

Spleen: No splenic laceration or hematoma.

Adrenals/Urinary Tract:

--Adrenal glands: No adrenal hemorrhage.

--Right kidney/ureter: No hydronephrosis or perinephric hematoma.

--Left kidney/ureter: No hydronephrosis or perinephric hematoma.

--Urinary bladder: Unremarkable.

Stomach/Bowel:

--Stomach/Duodenum: No hiatal hernia or other gastric abnormality.
Normal duodenal course and caliber.

--Small bowel: No dilatation or inflammation.

--Colon: No focal abnormality.

--Appendix: Normal.

Vascular/Lymphatic: Normal course and caliber of the major abdominal
vessels.

--there is some mildly prominent retroperitoneal lymph nodes.

--No mesenteric lymphadenopathy.

--No pelvic or inguinal lymphadenopathy.

Reproductive: Unremarkable

Other: No ascites or free air. The abdominal wall is normal.

Musculoskeletal. No acute displaced fractures.
IMPRESSION: No acute abnormality. No radiopaque kidney stones. No
hydronephrosis. Normal appendix in the right lower quadrant.

## 2022-03-09 ENCOUNTER — Ambulatory Visit
Admission: EM | Admit: 2022-03-09 | Discharge: 2022-03-09 | Disposition: A | Discharge status: Home / Self Care | Payer: BC Managed Care – PPO | Attending: Nurse Practitioner | Admitting: Nurse Practitioner

## 2022-03-09 ENCOUNTER — Encounter: Payer: Self-pay | Admitting: Emergency Medicine

## 2022-03-09 ENCOUNTER — Other Ambulatory Visit: Payer: Self-pay

## 2022-03-09 DIAGNOSIS — J029 Acute pharyngitis, unspecified: Secondary | ICD-10-CM

## 2022-03-09 LAB — POCT RAPID STREP A (OFFICE): Rapid Strep A Screen: NEGATIVE

## 2022-03-09 MED ORDER — LIDOCAINE VISCOUS HCL 2 % MT SOLN: 10.0000 mL | Freq: Four times a day (QID) | OROMUCOSAL | 0 refills | Status: AC | PRN

## 2022-03-09 MED ORDER — IBUPROFEN 800 MG PO TABS: 800.0000 mg | ORAL_TABLET | Freq: Three times a day (TID) | ORAL | 0 refills | Status: AC

## 2022-03-09 NOTE — Discharge Instructions (Addendum)
The rapid strep test is negative.  A throat culture is pending.  As discussed, if the results are positive, you will be contacted and provided treatment. Take medication as prescribed. Warm salt water gargles 3-4 times daily while symptoms persist. Recommend softer foods while symptoms persist to include soups, broths, yogurts, puddings, Jell-O, warm or cool liquids. Recommend using a teaspoon of honey to help with throat discomfort. Follow-up if symptoms do not improve within the next 7 to 10 days or sooner if symptoms worsen.

## 2022-03-09 NOTE — ED Provider Notes (Signed)
[facility]    CSN: [id number] Arrival date & time: 03/09/22  2993      History   Chief Complaint Chief Complaint  Patient presents with   Sore Throat    HPI Stuart Cook is a 36 y.o. male.   The history is provided by the patient.   Patient presents with a 3-day history of sore throat.  States when his symptoms started he also had generalized body aches, chills, and cough.  Reports the symptoms have resolved except for the sore throat.  He denies fever, headache, wheezing, shortness of breath, difficulty breathing, or GI symptoms.  Patient states he has been taking Tylenol and using a throat spray for his symptoms he is also been eating cough drops.  States symptoms started after he went on a call to a residence home who had multiple cats in the home.  Denies any other known sick contacts.  Past Medical History:  Diagnosis Date   GERD (gastroesophageal reflux disease)    Hypertension    Borderline and treated with diet alone.   Lyme disease    PTSD (post-traumatic stress disorder)    from [place]   RMSF [facility] spotted fever)    Seasonal allergies     Patient Active Problem List   Diagnosis Date Noted   Encephalopathy acute 02/05/2014   At high risk for tick borne illness 02/05/2014   Hypoxia 02/05/2014   Sepsis (HCC) 02/05/2014   Headache(784.0) 02/05/2014   Nausea with vomiting 02/05/2014   Left maxillary sinusitis 02/05/2014    Past Surgical History:  Procedure Laterality Date   ESOPHAGOGASTRODUODENOSCOPY ENDOSCOPY     KNEE SURGERY     right       Home Medications    Prior to Admission medications   Medication Sig Start Date End Date Taking? Authorizing Provider  Chlorpheniramine Maleate (ALLERGY PO) Take 1 tablet by mouth daily.   Yes [provider]  cholecalciferol (VITAMIN D3) 25 MCG (1000 UNIT) tablet Take 2,000 Units by mouth daily.   Yes [provider]  clomiPHENE (CLOMID) 50 MG tablet Take 30 mg by mouth  daily.   Yes [provider]  ibuprofen (ADVIL) 800 MG tablet Take 1 tablet (800 mg total) by mouth 3 (three) times daily. 03/09/22  Yes Esme Freund-Warren, Sadie Haber, NP  lidocaine (XYLOCAINE) 2 % solution Use as directed 10 mLs in the mouth or throat every 6 (six) hours as needed for mouth pain. 03/09/22  Yes Evangelyn Crouse-Warren, Sadie Haber, NP  cephALEXin (KEFLEX) 500 MG capsule Take 1 capsule (500 mg total) by mouth 4 (four) times daily. 05/04/19   Rancour, Jeannett Senior, MD  doxycycline (VIBRAMYCIN) 100 MG capsule Take 1 capsule (100 mg total) by mouth 2 (two) times daily. One po bid x 7 days 01/28/21   Bethann Berkshire, MD  fluticasone [facility]) 50 MCG/ACT nasal spray Place 1 spray into both nostrils every morning. 12/09/13   [provider]  HYDROcodone-acetaminophen (NORCO/VICODIN) 5-325 MG per tablet Take 1 tablet by mouth every 6 (six) hours as needed for moderate pain. 02/06/14   Elliot Cousin, MD  naproxen (NAPROSYN) 500 MG tablet Take 1 tablet (500 mg total) by mouth 2 (two) times daily. 05/04/19   Rancour, Jeannett Senior, MD  omeprazole (PRILOSEC) 20 MG capsule Take 20 mg by mouth once as needed.    [provider]  ondansetron (ZOFRAN ODT) 4 MG disintegrating tablet Take 1 tablet (4 mg total) by mouth every 8 (eight) hours as needed for nausea or  vomiting. 05/04/19   Rancour, Jeannett Senior, MD  oxyCODONE-acetaminophen (PERCOCET/ROXICET) 5-325 MG tablet Take 1 tablet by mouth every 4 (four) hours as needed. 01/04/18   Triplett, Tammy, PA-C  penicillin v potassium (VEETID) 500 MG tablet Take 1 tablet (500 mg total) by mouth 3 (three) times daily. 01/04/18   Triplett, Tammy, PA-C  predniSONE (DELTASONE) 20 MG tablet 2 tabs po daily x 3 days 01/28/21   Bethann Berkshire, MD    Family History History reviewed. No pertinent family history.  Social History Social History   Tobacco Use   Smoking status: Never   Smokeless tobacco: Never  Substance Use Topics   Alcohol use: No   Drug use: No      Allergies   Patient has no known allergies.   Review of Systems Review of Systems Per HPI  Physical Exam Triage Vital Signs ED Triage Vitals  Enc Vitals Group     BP 03/09/22 0827 (!) 140/96     Pulse Rate 03/09/22 0827 84     Resp 03/09/22 0827 18     Temp 03/09/22 0827 (!) 97.4 F (36.3 C)     Temp Source 03/09/22 0827 Oral     SpO2 03/09/22 0827 95 %     Weight --      Height --      Head Circumference --      Peak Flow --      Pain Score 03/09/22 0828 9     Pain Loc --      Pain Edu? --      Excl. in GC? --    No data found.  Updated Vital Signs BP (!) 140/96 (BP Location: Right Arm)   Pulse 84   Temp (!) 97.4 F (36.3 C) (Oral)   Resp 18   SpO2 95%   Visual Acuity Right Eye Distance:   Left Eye Distance:   Bilateral Distance:    Right Eye Near:   Left Eye Near:    Bilateral Near:     Physical Exam Vitals and nursing note reviewed.  Constitutional:      General: He is not in acute distress.    Appearance: Normal appearance. He is well-developed.  HENT:     Head: Normocephalic.     Right Ear: Tympanic membrane and ear canal normal.     Left Ear: Tympanic membrane and ear canal normal.     Nose: Congestion present.     Mouth/Throat:     Mouth: Mucous membranes are moist.     Pharynx: Pharyngeal swelling and posterior oropharyngeal erythema present.     Tonsils: No tonsillar exudate. 1+ on the right. 1+ on the left.  Eyes:     Conjunctiva/sclera: Conjunctivae normal.     Pupils: Pupils are equal, round, and reactive to light.  Cardiovascular:     Rate and Rhythm: Normal rate and regular rhythm.     Heart sounds: Normal heart sounds.  Pulmonary:     Effort: Pulmonary effort is normal. No respiratory distress.     Breath sounds: Normal breath sounds. No stridor. No wheezing, rhonchi or rales.  Abdominal:     General: Bowel sounds are normal.     Palpations: Abdomen is soft.     Tenderness: There is no abdominal tenderness.   Musculoskeletal:     Cervical back: Normal range of motion.  Lymphadenopathy:     Cervical: No cervical adenopathy.  Skin:    General: Skin is warm and dry.  Neurological:  General: No focal deficit present.     Mental Status: He is alert and oriented to person, place, and time.  Psychiatric:        Mood and Affect: Mood normal.        Behavior: Behavior normal.      UC Treatments / Results  Labs (all labs ordered are listed, but only abnormal results are displayed) Labs Reviewed  CULTURE, GROUP A STREP [facility])  POCT RAPID STREP A (OFFICE)    EKG   Radiology No results found.  Procedures Procedures (including critical care time)  Medications Ordered in UC Medications - No data to display  Initial Impression / Assessment and Plan / UC Course  I have reviewed the triage vital signs and the nursing notes.  Pertinent labs & imaging results that were available during my care of the patient were reviewed by me and considered in my medical decision making (see chart for details).  Patient presents with sore throat that has been present for the past 3 days.  On exam, patient has +1 tonsil swelling with erythema, but no exudate is present.  No cervical adenopathy.  Patient's vital signs are stable, exam is otherwise benign.  Rapid strep test is negative, throat culture is pending.  In the interim, will provide symptomatic treatment with viscous lidocaine.  Supportive care recommendations were provided to the patient.  Patient advised to follow-up if symptoms do not improve within the next 7 to 10 days. Final Clinical Impressions(s) / UC Diagnoses   Final diagnoses:  Acute pharyngitis, unspecified etiology     Discharge Instructions      The rapid strep test is negative.  A throat culture is pending.  As discussed, if the results are positive, you will be contacted and provided treatment. Take medication as prescribed. Warm salt water gargles 3-4 times daily while  symptoms persist. Recommend softer foods while symptoms persist to include soups, broths, yogurts, puddings, Jell-O, warm or cool liquids. Recommend using a teaspoon of honey to help with throat discomfort. Follow-up if symptoms do not improve within the next 7 to 10 days or sooner if symptoms worsen.      ED Prescriptions     Medication Sig Dispense Auth. Provider   lidocaine (XYLOCAINE) 2 % solution Use as directed 10 mLs in the mouth or throat every 6 (six) hours as needed for mouth pain. 100 mL Onix Jumper-Warren, Sadie Haber, NP   ibuprofen (ADVIL) 800 MG tablet Take 1 tablet (800 mg total) by mouth 3 (three) times daily. 21 tablet Demarri Elie-Warren, Sadie Haber, NP      PDMP not reviewed this encounter.   Abran Cantor, NP 03/09/22 [phone]

## 2022-03-09 NOTE — ED Triage Notes (Signed)
Pt reports generalized body aches Monday night and reports cough, sore throat,chills,body aches. Pt reports all symptoms have resolved except sore throat.

## 2022-03-12 LAB — CULTURE, GROUP A STREP (THRC)
